# Patient Record
Sex: Male | Born: 1955 | Race: White | Hispanic: No | Marital: Married | Smoking: Former smoker
Health system: Southern US, Community
[De-identification: ages and names within clinical notes are randomized; demographics above are authoritative.]

## PROBLEM LIST (undated history)

## (undated) DIAGNOSIS — F32A Depression, unspecified: Secondary | ICD-10-CM

## (undated) DIAGNOSIS — T7840XA Allergy, unspecified, initial encounter: Secondary | ICD-10-CM

## (undated) DIAGNOSIS — R17 Unspecified jaundice: Secondary | ICD-10-CM

## (undated) DIAGNOSIS — R51 Headache: Secondary | ICD-10-CM

## (undated) DIAGNOSIS — F419 Anxiety disorder, unspecified: Secondary | ICD-10-CM

## (undated) DIAGNOSIS — E079 Disorder of thyroid, unspecified: Secondary | ICD-10-CM

## (undated) DIAGNOSIS — K589 Irritable bowel syndrome without diarrhea: Secondary | ICD-10-CM

## (undated) DIAGNOSIS — Z8739 Personal history of other diseases of the musculoskeletal system and connective tissue: Secondary | ICD-10-CM

## (undated) DIAGNOSIS — I10 Essential (primary) hypertension: Secondary | ICD-10-CM

## (undated) DIAGNOSIS — Z87442 Personal history of urinary calculi: Secondary | ICD-10-CM

## (undated) DIAGNOSIS — G4730 Sleep apnea, unspecified: Secondary | ICD-10-CM

## (undated) DIAGNOSIS — F329 Major depressive disorder, single episode, unspecified: Secondary | ICD-10-CM

## (undated) HISTORY — DX: Depression, unspecified: F32.A

## (undated) HISTORY — DX: Hypercalcemia: E83.52

## (undated) HISTORY — DX: Major depressive disorder, single episode, unspecified: F32.9

## (undated) HISTORY — DX: Personal history of other diseases of the musculoskeletal system and connective tissue: Z87.39

## (undated) HISTORY — DX: Essential (primary) hypertension: I10

## (undated) HISTORY — DX: Irritable bowel syndrome, unspecified: K58.9

## (undated) HISTORY — DX: Anxiety disorder, unspecified: F41.9

## (undated) HISTORY — DX: Allergy, unspecified, initial encounter: T78.40XA

## (undated) HISTORY — DX: Disorder of thyroid, unspecified: E07.9

---

## 1998-03-31 HISTORY — PX: MOLE REMOVAL: SHX2046

## 2000-03-31 DIAGNOSIS — Z87442 Personal history of urinary calculi: Secondary | ICD-10-CM

## 2000-03-31 HISTORY — DX: Personal history of urinary calculi: Z87.442

## 2005-10-13 ENCOUNTER — Encounter: Admission: RE | Admit: 2005-10-13 | Discharge: 2005-10-13 | Payer: Self-pay | Admitting: Specialist

## 2006-01-13 ENCOUNTER — Encounter: Admission: RE | Admit: 2006-01-13 | Discharge: 2006-02-16 | Payer: Self-pay | Admitting: Family Medicine

## 2007-06-14 ENCOUNTER — Emergency Department (HOSPITAL_COMMUNITY): Admission: EM | Admit: 2007-06-14 | Discharge: 2007-06-14 | Payer: Self-pay | Admitting: Emergency Medicine

## 2008-03-31 HISTORY — PX: COLONOSCOPY: SHX174

## 2009-01-26 ENCOUNTER — Emergency Department (HOSPITAL_COMMUNITY): Admission: EM | Admit: 2009-01-26 | Discharge: 2009-01-26 | Payer: Self-pay | Admitting: Emergency Medicine

## 2010-04-21 ENCOUNTER — Encounter: Payer: Self-pay | Admitting: General Surgery

## 2010-07-04 LAB — COMPREHENSIVE METABOLIC PANEL
AST: 30 U/L (ref 0–37)
Alkaline Phosphatase: 65 U/L (ref 39–117)
BUN: 16 mg/dL (ref 6–23)
CO2: 27 mEq/L (ref 19–32)
Chloride: 107 mEq/L (ref 96–112)
GFR calc Af Amer: >60 mL/min (ref >60)
Glucose, Bld: 126 mg/dL — ABNORMAL HIGH (ref 70–99)
Potassium: 4.3 mEq/L (ref 3.5–5.1)
Sodium: 139 mEq/L (ref 135–145)
Total Bilirubin: 1.6 mg/dL — ABNORMAL HIGH (ref 0.3–1.2)
Total Protein: 6.7 g/dL (ref 6.0–8.3)

## 2010-07-04 LAB — DIFFERENTIAL
Basophils Absolute: 0 10*3/uL (ref 0.0–0.1)
Basophils Relative: 0 % (ref 0–1)
Eosinophils Absolute: 0 10*3/uL (ref 0.0–0.7)
Monocytes Absolute: 0.3 10*3/uL (ref 0.1–1.0)
Monocytes Relative: 4 % (ref 3–12)

## 2010-07-04 LAB — URINALYSIS, ROUTINE W REFLEX MICROSCOPIC
Bilirubin Urine: NEGATIVE
Glucose, UA: NEGATIVE mg/dL
Hgb urine dipstick: NEGATIVE
Ketones, ur: NEGATIVE mg/dL
Protein, ur: NEGATIVE mg/dL
Urobilinogen, UA: 1 mg/dL (ref 0.0–1.0)

## 2010-07-04 LAB — CBC
HCT: 38 % — ABNORMAL LOW (ref 39.0–52.0)
Hemoglobin: 12.9 g/dL — ABNORMAL LOW (ref 13.0–17.0)
RBC: 4.15 MIL/uL — ABNORMAL LOW (ref 4.22–5.81)

## 2010-12-23 LAB — URINALYSIS, ROUTINE W REFLEX MICROSCOPIC
Bilirubin Urine: NEGATIVE
Glucose, UA: NEGATIVE
Hgb urine dipstick: NEGATIVE
Ketones, ur: NEGATIVE
pH: 5.5

## 2010-12-23 LAB — I-STAT 8, (EC8 V) (CONVERTED LAB)
Acid-Base Excess: 2
BUN: 17
Chloride: 105
Glucose, Bld: 117 — ABNORMAL HIGH
Potassium: 3.8
pH, Ven: 7.328 — ABNORMAL HIGH

## 2010-12-23 LAB — HEPATIC FUNCTION PANEL
AST: 24
Albumin: 4.1

## 2010-12-23 LAB — DIFFERENTIAL
Basophils Absolute: 0
Basophils Relative: 0
Lymphocytes Relative: 25
Monocytes Relative: 8
Neutro Abs: 3.8
Neutrophils Relative %: 65

## 2010-12-23 LAB — CBC
MCHC: 35.2
RBC: 4.35
RDW: 12.9

## 2010-12-23 LAB — POCT I-STAT CREATININE
Creatinine, Ser: 1.3
Operator id: 277751

## 2011-06-26 ENCOUNTER — Other Ambulatory Visit: Payer: Self-pay | Admitting: Family Medicine

## 2011-06-26 DIAGNOSIS — M542 Cervicalgia: Secondary | ICD-10-CM

## 2011-07-05 ENCOUNTER — Ambulatory Visit
Admission: RE | Admit: 2011-07-05 | Discharge: 2011-07-05 | Disposition: A | Discharge status: Home / Self Care | Payer: BC Managed Care – PPO | Source: Ambulatory Visit | Attending: Family Medicine | Admitting: Family Medicine

## 2011-07-05 DIAGNOSIS — M542 Cervicalgia: Secondary | ICD-10-CM

## 2011-07-05 MED ORDER — GADOBENATE DIMEGLUMINE 529 MG/ML IV SOLN
20.0000 mL | Freq: Once | INTRAVENOUS | Status: AC | PRN
  Administered 2011-07-05: 20 mL via INTRAVENOUS

## 2011-07-22 ENCOUNTER — Encounter (INDEPENDENT_AMBULATORY_CARE_PROVIDER_SITE_OTHER): Payer: Self-pay | Admitting: General Surgery

## 2011-07-24 ENCOUNTER — Encounter (INDEPENDENT_AMBULATORY_CARE_PROVIDER_SITE_OTHER): Payer: Self-pay | Admitting: General Surgery

## 2011-07-31 ENCOUNTER — Encounter (INDEPENDENT_AMBULATORY_CARE_PROVIDER_SITE_OTHER): Payer: BC Managed Care – PPO | Admitting: General Surgery

## 2011-10-14 ENCOUNTER — Encounter (INDEPENDENT_AMBULATORY_CARE_PROVIDER_SITE_OTHER): Payer: Self-pay | Admitting: General Surgery

## 2011-10-17 ENCOUNTER — Ambulatory Visit (INDEPENDENT_AMBULATORY_CARE_PROVIDER_SITE_OTHER): Payer: BC Managed Care – PPO | Admitting: General Surgery

## 2011-10-17 ENCOUNTER — Encounter (INDEPENDENT_AMBULATORY_CARE_PROVIDER_SITE_OTHER): Payer: Self-pay | Admitting: General Surgery

## 2011-10-24 ENCOUNTER — Encounter (INDEPENDENT_AMBULATORY_CARE_PROVIDER_SITE_OTHER): Payer: Self-pay | Admitting: General Surgery

## 2011-10-24 ENCOUNTER — Ambulatory Visit (HOSPITAL_COMMUNITY)
Admission: RE | Admit: 2011-10-24 | Discharge: 2011-10-24 | Disposition: A | Discharge status: Home / Self Care | Payer: BC Managed Care – PPO | Source: Ambulatory Visit | Attending: General Surgery | Admitting: General Surgery

## 2011-10-24 ENCOUNTER — Encounter (HOSPITAL_COMMUNITY)
Admission: RE | Admit: 2011-10-24 | Discharge: 2011-10-24 | Disposition: A | Discharge status: Home / Self Care | Payer: BC Managed Care – PPO | Source: Ambulatory Visit | Attending: General Surgery | Admitting: General Surgery

## 2011-10-24 MED ORDER — SODIUM PERTECHNETATE TC 99M INJECTION
25.0000 | Freq: Once | INTRAVENOUS | Status: AC | PRN
  Administered 2011-10-24: 25 via INTRAVENOUS

## 2011-10-24 NOTE — Progress Notes (Signed)
Subjective:     Patient ID: Wayne Dixon, male   DOB: 1955-11-22, 56 y.o.   MRN: [id number]  HPI We're asked to see the patient in consultation by Dr. Abigail Miyamoto to evaluate him for elevated calcium levels. The patient is a 56 year old white male who has been having some very vague symptoms. He experienced a kidney stone in 2000 but has not had another episode of this. He states that 25 years ago he was found to be hyperthyroid and this was treated medically. Over the last year or so he's had a couple episodes of right upper quadrant pain but has not had this recently. He did have an episode of diarrhea and stomach cramps last night but this has resolved. Except for these occasional issues in general he feels well most of the time. He recently had an ionized calcium checked which was just slightly above normal. He also had a parathyroid hormone checked it was in the normal range.  Review of Systems  Constitutional: Negative.   HENT: Negative.   Eyes: Negative.   Respiratory: Negative.   Cardiovascular: Negative.   Gastrointestinal: Negative.   Genitourinary: Negative.   Musculoskeletal: Negative.   Skin: Negative.   Neurological: Negative.   Hematological: Negative.   Psychiatric/Behavioral: Negative.        Objective:   Physical Exam  Constitutional: He is oriented to person, place, and time. He appears well-developed and well-nourished.  HENT:  Head: Normocephalic and atraumatic.  Eyes: Conjunctivae and EOM are normal. Pupils are equal, round, and reactive to light.  Neck: Normal range of motion. Neck supple. No thyromegaly present.  Cardiovascular: Normal rate, regular rhythm and normal heart sounds.   Pulmonary/Chest: Effort normal and breath sounds normal.  Abdominal: Soft. Bowel sounds are normal. He exhibits no mass. There is no tenderness.  Musculoskeletal: Normal range of motion.  Lymphadenopathy:    He has no cervical adenopathy.  Neurological: He is alert and oriented to  person, place, and time.  Skin: Skin is warm and dry.  Psychiatric: He has a normal mood and affect. His behavior is normal.       Assessment:     The patient seems to have some slight hypercalcemia but it's not clear whether his symptoms are related to this. His parathyroid hormone is normal. He had a recent MRI study that questioned whether he could have a parathyroid adenoma.    Plan:     At this point we will obtain a sestamibi scan to look for a hyperfunctioning parathyroid gland. I would also like him to see Dr. Gerrit Friends in our group for a second opinion

## 2011-11-10 ENCOUNTER — Encounter (INDEPENDENT_AMBULATORY_CARE_PROVIDER_SITE_OTHER): Payer: Self-pay

## 2011-11-24 ENCOUNTER — Ambulatory Visit (INDEPENDENT_AMBULATORY_CARE_PROVIDER_SITE_OTHER): Payer: BC Managed Care – PPO | Admitting: Surgery

## 2011-11-24 ENCOUNTER — Encounter (INDEPENDENT_AMBULATORY_CARE_PROVIDER_SITE_OTHER): Payer: Self-pay | Admitting: Surgery

## 2011-11-24 VITALS — BP 158/96 | HR 85 | Temp 97.0°F | Resp 18 | Ht 72.0 in | Wt 244.4 lb

## 2011-11-24 DIAGNOSIS — E210 Primary hyperparathyroidism: Secondary | ICD-10-CM

## 2011-11-24 NOTE — Progress Notes (Signed)
General Surgery [facility]  Chief Complaint  Patient presents with  . Pre-op Exam    Eval thyriod    HISTORY: Patient is a 56 year old white male referred by his primary care physician for hypercalcemia. Serum calcium level is elevated at 10.9 and an ionized calcium level is elevated at 5.9. Intact PTH level was upper range of normal at 64. The patient subsequently underwent MRI scan of the neck which showed a 7 x 9 mm nodule in the left inferior position. A nuclear medicine parathyroid scan performed on 10/24/2011 demonstrated activity in the left inferior position corresponding with a nodule seen on MRI scan and consistent with a parathyroid adenoma.  Patient has had symptoms including fatigue, irritability, bone and joint pain, abdominal pain with negative GI workup, and history of nephrolithiasis in 2002. Patient has a distant history of hyperthyroidism managed medically.  Past Medical History  Diagnosis Date  . History of gout   . Hypercalcemia   . Depression   . Hypertension   . Anxiety   . Allergy   . IBS (irritable bowel syndrome)   . Thyroid disease      Current Outpatient Prescriptions  Medication Sig Dispense Refill  . benazepril (LOTENSIN) 20 MG tablet Take 20 mg by mouth daily.      . citalopram (CELEXA) 20 MG tablet Take 20 mg by mouth daily.      . fexofenadine (ALLEGRA) 180 MG tablet Take 180 mg by mouth daily.      . montelukast (SINGULAIR) 10 MG tablet Take 10 mg by mouth at bedtime.         No Known Allergies   History reviewed. No pertinent family history.   History   Social History  . Marital Status: Married    Spouse Name: N/A    Number of Children: N/A  . Years of Education: N/A   Social History Main Topics  . Smoking status: Former Games developer  . Smokeless tobacco: Former Neurosurgeon    Quit date: 10/17/1986  . Alcohol Use: Yes  . Drug Use: No  . Sexually Active:    Other Topics Concern  . None   Social History Narrative    . None     REVIEW OF SYSTEMS - PERTINENT POSITIVES ONLY: Fatigue, irritability, bone and joint pain, nephrolithiasis, abdominal pain.  EXAM: Filed Vitals:   11/24/11 1408  BP: 158/96  Pulse: 85  Temp: 97 F (36.1 C)  Resp: 18    HEENT: normocephalic; pupils equal and reactive; sclerae clear; dentition good; mucous membranes moist NECK:  symmetric on extension; no palpable anterior or posterior cervical lymphadenopathy; no supraclavicular masses; no tenderness CHEST: clear to auscultation bilaterally without rales, rhonchi, or wheezes CARDIAC: regular rate and rhythm without significant murmur; peripheral pulses are full ABDOMEN: soft without distension; bowel sounds present; no mass; no hepatosplenomegaly; no hernia EXT:  non-tender without edema; no deformity NEURO: no gross focal deficits; no sign of tremor   LABORATORY RESULTS: See [facility] ([facility]) for most recent results   RADIOLOGY RESULTS: See [facility] ([facility]pic) for most recent results   IMPRESSION: Primary hyperparathyroidism, 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 I suspect that he has a parathyroid adenoma in the left inferior position resulting in primary hyperparathyroidism. Patient is symptomatic. I have recommended minimally invasive surgery for left inferior parathyroidectomy. We have discussed the possibility of 4 gland exploration. I provided him with  written literature to review. He understands and wishes to proceed. We will make arrangements for surgery in the near future.  The risks and benefits of the procedure have been discussed at length with the patient.  The patient understands the proposed procedure, potential alternative treatments, and the course of recovery to be expected.  All of the patient's questions have been answered at this time.  The patient wishes to proceed with surgery.  Velora Heckler, MD, FACS General & Endocrine Surgery [facility]   Visit Diagnoses: 1. Hyperparathyroidism, primary     Primary Care Physician: Irving Copas, MD

## 2011-11-24 NOTE — Patient Instructions (Signed)
[facility] [phone]  THYROID & PARATHYROID SURGERY -- POST OP INSTRUCTIONS  Always review your discharge instruction sheet from the facility where your surgery was performed.  1. A prescription for pain medication may be given to you upon discharge.  Take your pain medication as prescribed.  If narcotic pain medicine is not needed, then you may take acetaminophen (Tylenol) or ibuprofen (Advil) as needed. 2. Take your usually prescribed medications unless otherwise directed. 3. If you need a refill on your pain medication, please contact your pharmacy. They will contact our office to request authorization.  Prescriptions will not be processed after 5 pm or on weekends. 4. Start with a light diet upon arrival home, such as soup and crackers or toast.  Be sure to drink plenty of fluids daily.  Resume your normal diet the day after surgery. 5. Most patients will experience some swelling and bruising on the chest and neck area.  Ice packs will help.  Swelling and bruising can take several days to resolve.  6. It is common to experience some constipation if taking pain medication after surgery.  Increasing fluid intake and taking a stool softener will usually help or prevent this problem.  A mild laxative (Milk of Magnesia or Miralax) should be taken according to package directions if there are no bowel movements after 48 hours. 7. You may remove your bandages 24-48 hours after surgery, and you may shower at that time.  You have steri-strips (small skin tapes) in place directly over the incision.  These strips should be left on the skin for 7-10 days and then removed. 8. You may resume regular (light) daily activities beginning the next day-such as daily self-care, walking, climbing stairs-gradually increasing activities as tolerated.  You may have sexual intercourse when it is comfortable.  Refrain from any heavy lifting or straining until approved by your doctor.  You may drive when  you no longer are taking prescription pain medication, you can comfortably wear a seatbelt, and you can safely maneuver your car and apply brakes. 9. You should see your doctor in the office for a follow-up appointment approximately two to three weeks after your surgery.  Make sure that you call for this appointment within a day or two after you arrive home to insure a convenient appointment time.  WHEN TO CALL YOUR DOCTOR: 1. Fever greater than 101.5 2. Inability to urinate 3. Nausea and/or vomiting - persistent 4. Extreme swelling or bruising 5. Continued bleeding from incision 6. Increased pain, redness, or drainage from the incision 7. Difficulty swallowing or breathing 8. Muscle cramping or spasms 9. Numbness or tingling in hands or around lips  The clinic staff is available to answer your questions during regular business hours.  Please don't hesitate to call and ask to speak to one of the nurses if you have concerns. [facility] [phone]  [email or web address]   

## 2011-12-18 ENCOUNTER — Other Ambulatory Visit (INDEPENDENT_AMBULATORY_CARE_PROVIDER_SITE_OTHER): Payer: Self-pay | Admitting: Surgery

## 2011-12-18 ENCOUNTER — Encounter (HOSPITAL_COMMUNITY): Payer: Self-pay | Admitting: Pharmacy Technician

## 2011-12-18 NOTE — Progress Notes (Signed)
Dr.Gerkin,    Please enter preop orders in Epic for Wayne Dixon--his surgery date is 9/26 --he is coming to [facility]  For preop labs on Monday 9/23.    Evalina Field RN

## 2011-12-22 ENCOUNTER — Encounter (HOSPITAL_COMMUNITY): Payer: Self-pay

## 2011-12-22 ENCOUNTER — Encounter (HOSPITAL_COMMUNITY)
Admission: RE | Admit: 2011-12-22 | Discharge: 2011-12-22 | Disposition: A | Discharge status: Home / Self Care | Payer: BC Managed Care – PPO | Source: Ambulatory Visit | Attending: Surgery | Admitting: Surgery

## 2011-12-22 ENCOUNTER — Ambulatory Visit (HOSPITAL_COMMUNITY)
Admission: RE | Admit: 2011-12-22 | Discharge: 2011-12-22 | Disposition: A | Discharge status: Home / Self Care | Payer: BC Managed Care – PPO | Source: Ambulatory Visit | Attending: Surgery | Admitting: Surgery

## 2011-12-22 DIAGNOSIS — E210 Primary hyperparathyroidism: Secondary | ICD-10-CM | POA: Insufficient documentation

## 2011-12-22 DIAGNOSIS — Z01810 Encounter for preprocedural cardiovascular examination: Secondary | ICD-10-CM | POA: Insufficient documentation

## 2011-12-22 DIAGNOSIS — Z01812 Encounter for preprocedural laboratory examination: Secondary | ICD-10-CM | POA: Insufficient documentation

## 2011-12-22 DIAGNOSIS — I10 Essential (primary) hypertension: Secondary | ICD-10-CM | POA: Insufficient documentation

## 2011-12-22 HISTORY — DX: Sleep apnea, unspecified: G47.30

## 2011-12-22 HISTORY — DX: Unspecified jaundice: R17

## 2011-12-22 HISTORY — DX: Personal history of urinary calculi: Z87.442

## 2011-12-22 HISTORY — DX: Headache: R51

## 2011-12-22 LAB — CBC
HCT: 42.7 % (ref 39.0–52.0)
MCH: 30.9 pg (ref 26.0–34.0)
MCHC: 34.9 g/dL (ref 30.0–36.0)
MCV: 88.6 fL (ref 78.0–100.0)
RDW: 12.9 % (ref 11.5–15.5)

## 2011-12-22 LAB — BASIC METABOLIC PANEL
BUN: 17 mg/dL (ref 6–23)
Creatinine, Ser: 1.13 mg/dL (ref 0.50–1.35)
GFR calc Af Amer: 83 mL/min — ABNORMAL LOW (ref >90)
GFR calc non Af Amer: 72 mL/min — ABNORMAL LOW (ref >90)

## 2011-12-22 NOTE — Patient Instructions (Addendum)
[name]  12/22/2011   Your procedure is scheduled on: 12/25/11 at 7:30 am   Report to [facility]  AT: 5:15 AM.  Call this number if you have problems the morning of surgery: [phone]   Remember:   Do not eat food or drink liquids AFTER MIDNIGHT    Take these medicines the morning of surgery with A SIP OF WATER: CELEXA    Do not wear jewelry, make-up   Do not wear lotions, powders, or perfumes.   Do not shave legs or underarms 12 hrs. before surgery (men may shave face)  Do not bring valuables to the hospital.  Contacts, dentures or bridgework may not be worn into surgery.  Leave suitcase in the car. After surgery it may be brought to your room.  For patients admitted to the hospital more than one night, checkout time is 11:00   AM the day of discharge.   Patients discharged the day of surgery will not be allowed to drive home.  If going home same day of surgery, must have someone stay with you first  24 hrs at home and arrange for some one to drive you home from hospital.    Special Instructions:   Please read over the following fact sheets that you were given:               1. MRSA  INFORMATION                      2. [facility] PREPARING FOR SURGERY SHEET                                     X_______________________________________________________________

## 2011-12-25 ENCOUNTER — Encounter (HOSPITAL_COMMUNITY): Payer: Self-pay | Admitting: Anesthesiology

## 2011-12-25 ENCOUNTER — Encounter (HOSPITAL_COMMUNITY): Payer: Self-pay | Admitting: *Deleted

## 2011-12-25 ENCOUNTER — Ambulatory Visit (HOSPITAL_COMMUNITY): Payer: BC Managed Care – PPO | Admitting: Anesthesiology

## 2011-12-25 ENCOUNTER — Other Ambulatory Visit (INDEPENDENT_AMBULATORY_CARE_PROVIDER_SITE_OTHER): Payer: Self-pay

## 2011-12-25 ENCOUNTER — Encounter (HOSPITAL_COMMUNITY)
Admission: RE | Disposition: A | Discharge status: Home / Self Care | Payer: Self-pay | Source: Ambulatory Visit | Attending: Surgery

## 2011-12-25 ENCOUNTER — Ambulatory Visit (HOSPITAL_COMMUNITY)
Admission: RE | Admit: 2011-12-25 | Discharge: 2011-12-25 | Disposition: A | Discharge status: Home / Self Care | Payer: BC Managed Care – PPO | Source: Ambulatory Visit | Attending: Surgery | Admitting: Surgery

## 2011-12-25 ENCOUNTER — Telehealth (INDEPENDENT_AMBULATORY_CARE_PROVIDER_SITE_OTHER): Payer: Self-pay

## 2011-12-25 ENCOUNTER — Encounter (HOSPITAL_COMMUNITY): Payer: Self-pay | Admitting: Surgery

## 2011-12-25 DIAGNOSIS — G4730 Sleep apnea, unspecified: Secondary | ICD-10-CM | POA: Insufficient documentation

## 2011-12-25 DIAGNOSIS — E210 Primary hyperparathyroidism: Secondary | ICD-10-CM | POA: Diagnosis present

## 2011-12-25 DIAGNOSIS — D351 Benign neoplasm of parathyroid gland: Secondary | ICD-10-CM

## 2011-12-25 DIAGNOSIS — I10 Essential (primary) hypertension: Secondary | ICD-10-CM | POA: Insufficient documentation

## 2011-12-25 DIAGNOSIS — Z79899 Other long term (current) drug therapy: Secondary | ICD-10-CM | POA: Insufficient documentation

## 2011-12-25 HISTORY — PX: PARATHYROIDECTOMY: SHX19

## 2011-12-25 SURGERY — PARATHYROIDECTOMY
Anesthesia: General | Site: Neck | Wound class: Clean

## 2011-12-25 MED ORDER — LIDOCAINE HCL (CARDIAC) 20 MG/ML IV SOLN
INTRAVENOUS | Status: DC | PRN
  Administered 2011-12-25: 50 mg via INTRAVENOUS

## 2011-12-25 MED ORDER — ACETAMINOPHEN 10 MG/ML IV SOLN
INTRAVENOUS | Status: DC | PRN
  Administered 2011-12-25: 1000 mg via INTRAVENOUS

## 2011-12-25 MED ORDER — HYDROCODONE-ACETAMINOPHEN 5-325 MG PO TABS
1.0000 | ORAL_TABLET | ORAL | Status: DC | PRN
  Administered 2011-12-25: 1 via ORAL
  Filled 2011-12-25: qty 1

## 2011-12-25 MED ORDER — LACTATED RINGERS IV SOLN
INTRAVENOUS | Status: DC
  Administered 2011-12-25: 08:00:00 via INTRAVENOUS
  Administered 2011-12-25: 1000 mL via INTRAVENOUS

## 2011-12-25 MED ORDER — BUPIVACAINE HCL 0.25 % IJ SOLN
INTRAMUSCULAR | Status: DC | PRN
  Administered 2011-12-25: 10 mL

## 2011-12-25 MED ORDER — ACETAMINOPHEN 10 MG/ML IV SOLN
INTRAVENOUS | Status: AC
  Filled 2011-12-25: qty 100

## 2011-12-25 MED ORDER — HYDROMORPHONE HCL PF 1 MG/ML IJ SOLN: 0.2500 mg | INTRAMUSCULAR | Status: DC | PRN

## 2011-12-25 MED ORDER — HYDROCODONE-ACETAMINOPHEN 5-325 MG PO TABS: 1.0000 | ORAL_TABLET | ORAL | Status: DC | PRN

## 2011-12-25 MED ORDER — PROPOFOL 10 MG/ML IV BOLUS
INTRAVENOUS | Status: DC | PRN
  Administered 2011-12-25: 230 mg via INTRAVENOUS

## 2011-12-25 MED ORDER — NEOSTIGMINE METHYLSULFATE 1 MG/ML IJ SOLN
INTRAMUSCULAR | Status: DC | PRN
  Administered 2011-12-25: 4 mg via INTRAVENOUS

## 2011-12-25 MED ORDER — LACTATED RINGERS IV SOLN: INTRAVENOUS | Status: DC

## 2011-12-25 MED ORDER — ONDANSETRON HCL 4 MG/2ML IJ SOLN
INTRAMUSCULAR | Status: DC | PRN
  Administered 2011-12-25: 4 mg via INTRAVENOUS

## 2011-12-25 MED ORDER — CEFAZOLIN SODIUM-DEXTROSE 2-3 GM-% IV SOLR
INTRAVENOUS | Status: AC
  Filled 2011-12-25: qty 50

## 2011-12-25 MED ORDER — ROCURONIUM BROMIDE 100 MG/10ML IV SOLN
INTRAVENOUS | Status: DC | PRN
  Administered 2011-12-25: 5 mg via INTRAVENOUS
  Administered 2011-12-25 (×2): 15 mg via INTRAVENOUS
  Administered 2011-12-25: 10 mg via INTRAVENOUS
  Administered 2011-12-25: 20 mg via INTRAVENOUS

## 2011-12-25 MED ORDER — HYDRALAZINE HCL 20 MG/ML IJ SOLN
INTRAMUSCULAR | Status: DC | PRN
  Administered 2011-12-25 (×3): 5 mg via INTRAVENOUS

## 2011-12-25 MED ORDER — SUCCINYLCHOLINE CHLORIDE 20 MG/ML IJ SOLN
INTRAMUSCULAR | Status: DC | PRN
  Administered 2011-12-25: 120 mg via INTRAVENOUS

## 2011-12-25 MED ORDER — FENTANYL CITRATE 0.05 MG/ML IJ SOLN
INTRAMUSCULAR | Status: DC | PRN
  Administered 2011-12-25: 100 ug via INTRAVENOUS
  Administered 2011-12-25 (×2): 50 ug via INTRAVENOUS
  Administered 2011-12-25: 100 ug via INTRAVENOUS
  Administered 2011-12-25: 50 ug via INTRAVENOUS
  Administered 2011-12-25: 100 ug via INTRAVENOUS

## 2011-12-25 MED ORDER — FENTANYL CITRATE 0.05 MG/ML IJ SOLN
INTRAMUSCULAR | Status: AC
  Filled 2011-12-25: qty 2

## 2011-12-25 MED ORDER — 0.9 % SODIUM CHLORIDE (POUR BTL) OPTIME
TOPICAL | Status: DC | PRN
  Administered 2011-12-25: 1000 mL

## 2011-12-25 MED ORDER — BUPIVACAINE HCL (PF) 0.25 % IJ SOLN
INTRAMUSCULAR | Status: AC
  Filled 2011-12-25: qty 30

## 2011-12-25 MED ORDER — GLYCOPYRROLATE 0.2 MG/ML IJ SOLN
INTRAMUSCULAR | Status: DC | PRN
  Administered 2011-12-25: 0.6 mg via INTRAVENOUS

## 2011-12-25 MED ORDER — FENTANYL CITRATE 0.05 MG/ML IJ SOLN
25.0000 ug | INTRAMUSCULAR | Status: DC | PRN
  Administered 2011-12-25: 50 ug via INTRAVENOUS

## 2011-12-25 MED ORDER — CEFAZOLIN SODIUM-DEXTROSE 2-3 GM-% IV SOLR
2.0000 g | INTRAVENOUS | Status: AC
  Administered 2011-12-25: 2 g via INTRAVENOUS

## 2011-12-25 SURGICAL SUPPLY — 42 items
ATTRACTOMAT 16X20 MAGNETIC DRP (DRAPES) ×2 IMPLANT
BENZOIN TINCTURE PRP APPL 2/3 (GAUZE/BANDAGES/DRESSINGS) ×2 IMPLANT
BLADE HEX COATED 2.75 (ELECTRODE) ×2 IMPLANT
BLADE SURG 15 STRL LF DISP TIS (BLADE) ×1 IMPLANT
BLADE SURG 15 STRL SS (BLADE) ×1
CANISTER SUCTION 2500CC (MISCELLANEOUS) ×2 IMPLANT
CHLORAPREP W/TINT 10.5 ML (MISCELLANEOUS) ×2 IMPLANT
CLIP TI MEDIUM 6 (CLIP) ×2 IMPLANT
CLIP TI WIDE RED SMALL 6 (CLIP) ×4 IMPLANT
CLOTH BEACON ORANGE TIMEOUT ST (SAFETY) ×2 IMPLANT
CONT SPECI 4OZ STER CLIK (MISCELLANEOUS) ×2 IMPLANT
DISSECTOR ROUND CHERRY 3/8 STR (MISCELLANEOUS) ×2 IMPLANT
DRAPE PED LAPAROTOMY (DRAPES) ×2 IMPLANT
DRESSING SURGICEL FIBRLLR 1X2 (HEMOSTASIS) ×1 IMPLANT
DRSG SURGICEL FIBRILLAR 1X2 (HEMOSTASIS) ×2
ELECT REM PT RETURN 9FT ADLT (ELECTROSURGICAL) ×2
ELECTRODE REM PT RTRN 9FT ADLT (ELECTROSURGICAL) ×1 IMPLANT
GAUZE SPONGE 4X4 16PLY XRAY LF (GAUZE/BANDAGES/DRESSINGS) ×2 IMPLANT
GLOVE BIOGEL PI IND STRL 6.5 (GLOVE) ×1 IMPLANT
GLOVE BIOGEL PI INDICATOR 6.5 (GLOVE) ×1
GLOVE SURG ORTHO 8.0 STRL STRW (GLOVE) ×2 IMPLANT
GLOVE SURG SS PI 6.5 STRL IVOR (GLOVE) ×2 IMPLANT
GOWN STRL NON-REIN LRG LVL3 (GOWN DISPOSABLE) ×4 IMPLANT
GOWN STRL REIN XL XLG (GOWN DISPOSABLE) ×2 IMPLANT
KIT BASIN OR (CUSTOM PROCEDURE TRAY) ×2 IMPLANT
NEEDLE HYPO 25X1 1.5 SAFETY (NEEDLE) ×2 IMPLANT
NS IRRIG 1000ML POUR BTL (IV SOLUTION) ×2 IMPLANT
PACK BASIC VI WITH GOWN DISP (CUSTOM PROCEDURE TRAY) ×2 IMPLANT
PENCIL BUTTON HOLSTER BLD 10FT (ELECTRODE) ×2 IMPLANT
SPONGE GAUZE 4X4 12PLY (GAUZE/BANDAGES/DRESSINGS) ×2 IMPLANT
STAPLER VISISTAT 35W (STAPLE) IMPLANT
STRIP CLOSURE SKIN 1/2X4 (GAUZE/BANDAGES/DRESSINGS) ×2 IMPLANT
SUT MNCRL AB 4-0 PS2 18 (SUTURE) ×2 IMPLANT
SUT SILK 2 0 (SUTURE)
SUT SILK 2-0 18XBRD TIE 12 (SUTURE) IMPLANT
SUT SILK 3 0 (SUTURE)
SUT SILK 3-0 18XBRD TIE 12 (SUTURE) IMPLANT
SUT VIC AB 3-0 SH 18 (SUTURE) ×2 IMPLANT
SYR BULB IRRIGATION 50ML (SYRINGE) ×2 IMPLANT
SYR CONTROL 10ML LL (SYRINGE) ×2 IMPLANT
TOWEL OR 17X26 10 PK STRL BLUE (TOWEL DISPOSABLE) ×2 IMPLANT
YANKAUER SUCT BULB TIP 10FT TU (MISCELLANEOUS) ×2 IMPLANT

## 2011-12-25 NOTE — H&P (Signed)
Wayne Dixon is an 56 y.o. male.    Chief Complaint: hypercalcemia, primary hyperparathyroidism  HPI: Patient is a 56 year old white male referred by his primary care physician for hypercalcemia. Serum calcium level is elevated at 10.9 and an ionized calcium level is elevated at 5.9. Intact PTH level was upper range of normal at 64. The patient subsequently underwent MRI scan of the neck which showed a 7 x 9 mm nodule in the left inferior position. A nuclear medicine parathyroid scan performed on 10/24/2011 demonstrated activity in the left inferior position corresponding with a nodule seen on MRI scan and consistent with a parathyroid adenoma.  Patient has had symptoms including fatigue, irritability, bone and joint pain, abdominal pain with negative GI workup, and history of nephrolithiasis in 2002. Patient has a distant history of hyperthyroidism managed medically.   Past Medical History  Diagnosis Date  . History of gout   . Hypercalcemia   . Depression   . Hypertension   . Anxiety   . Allergy   . IBS (irritable bowel syndrome)   . Thyroid disease   . Headache     due to cervical disc problem  . History of kidney stones 2002  . Elevated bilirubin   . Sleep apnea     mild doses not use c-pap    Past Surgical History  Procedure Date  . Mole removal 2000  . Colonoscopy 2010    History reviewed. No pertinent family history. Social History:  reports that he has quit smoking. He quit smokeless tobacco use about 25 years ago. He reports that he drinks alcohol. He reports that he does not use illicit drugs.  Allergies: No Known Allergies  Medications Prior to Admission  Medication Sig Dispense Refill  . benazepril (LOTENSIN) 40 MG tablet Take 20 mg by mouth daily with breakfast. Takes 1/2 tablet of 40mg  twice daily      . citalopram (CELEXA) 40 MG tablet Take 40 mg by mouth daily with breakfast. Takes 1/2 of 40 mg tab twice daily      . cyclobenzaprine (FLEXERIL) 10 MG tablet  Take 5-10 mg by mouth at bedtime as needed. Muscle spasms        No results found for this or any previous visit (from the past 48 hour(s)). No results found.  Review of Systems  Constitutional: Positive for malaise/fatigue.  HENT: Negative.   Eyes: Negative.   Respiratory: Negative.   Cardiovascular: Negative.   Gastrointestinal: Positive for abdominal pain.  Genitourinary: Negative.   Musculoskeletal: Positive for back pain and joint pain.  Skin: Negative.   Neurological: Negative.   Endo/Heme/Allergies: Negative.   Psychiatric/Behavioral: Negative.     Blood pressure 136/88, pulse 74, temperature 97.7 F (36.5 C), temperature source Oral, resp. rate 18, SpO2 97.00%. Physical Exam  Constitutional: He is oriented to person, place, and time. He appears well-developed and well-nourished. No distress.  HENT:  Head: Normocephalic and atraumatic.  Right Ear: External ear normal.  Left Ear: External ear normal.  Mouth/Throat: Oropharynx is clear and moist.  Eyes: Conjunctivae normal and EOM are normal. Pupils are equal, round, and reactive to light. No scleral icterus.  Neck: Normal range of motion. Neck supple. No tracheal deviation present. No thyromegaly present.  Cardiovascular: Normal rate, regular rhythm and normal heart sounds.   No murmur heard. Respiratory: Effort normal and breath sounds normal. He has no wheezes.  GI: Soft. Bowel sounds are normal. He exhibits no distension.  Musculoskeletal: Normal range of motion. He  exhibits no edema.  Lymphadenopathy:    He has no cervical adenopathy.  Neurological: He is alert and oriented to person, place, and time.  Skin: Skin is warm and dry.  Psychiatric: He has a normal mood and affect. His behavior is normal. Judgment and thought content normal.     Assessment/Plan Primary hyperparathyroidism, suspect left inferior parathyroid adenoma  - plan neck exploration with parathyroidectomy  The risks and benefits of the  procedure have been discussed at length with the patient.  The patient understands the proposed procedure, potential alternative treatments, and the course of recovery to be expected.  All of the patient's questions have been answered at this time.  The patient wishes to proceed with surgery.  Velora Heckler, MD, FACS General & Endocrine Surgery [facility] Office: [phone]   Alyn Jurney Judie Petit 12/25/2011, 7:24 AM

## 2011-12-25 NOTE — Op Note (Signed)
OPERATIVE REPORT - PARATHYROIDECTOMY  Preoperative diagnosis: Primary hyperparathyroidism  Postop diagnosis: Same  Procedure: Left inferior minimally invasive parathyroidectomy  Surgeon:  Velora Heckler, MD, FACS  Assistant:  Ovidio Kin, MD, FACS  Anesthesia: Gen. endotracheal  Estimated blood loss: Minimal  Preparation: ChloraPrep  Indications: Patient is a 56 year old white male referred by his primary care physician for hypercalcemia. Serum calcium level is elevated at 10.9 and an ionized calcium level is elevated at 5.9. Intact PTH level was upper range of normal at 64. The patient subsequently underwent MRI scan of the neck which showed a 7 x 9 mm nodule in the left inferior position. A nuclear medicine parathyroid scan performed on 10/24/2011 demonstrated activity in the left inferior position corresponding with a nodule seen on MRI scan and consistent with a parathyroid adenoma.  Patient has had symptoms including fatigue, irritability, bone and joint pain, abdominal pain with negative GI workup, and history of nephrolithiasis in 2002. Patient has a distant history of hyperthyroidism managed medically.   Procedure: Patient was prepared in the holding area. He was brought to operating room and placed in a supine position on the operating room table. Following administration of general anesthesia, the patient was positioned and then prepped and draped in the usual strict aseptic fashion. After ascertaining that an adequate level of anesthesia been achieved, a neck incision is made with a #15 blade. Dissection was carried through subcutaneous tissues and platysma. Hemostasis was obtained with the electrocautery. Skin flaps were developed circumferentially and a Weitlander retractor was placed for exposure.  Strap muscles were incised in the midline. Strap muscles were reflected exposing the thyroid lobe. With gentle blunt dissection the thyroid lobe was mobilized.  Dissection was carried  through adipose tissue and an enlarged parathyroid gland is identified. It is gently mobilized. Vascular structures are divided between small and medium ligaclips. Care is taken to avoid the recurrent laryngeal nerve and the esophagus. Gland is completely excised. It is submitted to pathology. Frozen section confirms parathyroid tissue consistent with adenoma.  Neck is irrigated with warm saline. Good hemostasis is noted. Fibrillar is placed in the operative field. Strap muscles are reapproximated in the midline with interrupted 3-0 Vicryl sutures. Platysma was closed with interrupted 3-0 Vicryl sutures. Skin is closed with a running 4-0 Monocryl subcuticular suture. Local anesthetic is infiltrated circumferentially. Wound was washed and dried and benzoin and Steri-Strips are applied. Sterile gauze dressing is applied. Patient is awakened from anesthesia and brought to the recovery room. The patient tolerated the procedure well.  Velora Heckler, MD, FACS General & Endocrine Surgery [facility] Office: [phone]

## 2011-12-25 NOTE — Transfer of Care (Signed)
Immediate Anesthesia Transfer of Care Note  Patient: Wayne Dixon  Procedure(s) Performed: Procedure(s) (LRB) with comments: PARATHYROIDECTOMY (N/A)  Patient Location: PACU  Anesthesia Type: General  Level of Consciousness: awake, alert  and oriented  Airway & Oxygen Therapy: Patient Spontanous Breathing and Patient connected to face mask oxygen  Post-op Assessment: Report given to PACU RN, Post -op Vital signs reviewed and stable and Patient moving all extremities  Post vital signs: Reviewed and stable  Complications: No apparent anesthesia complications

## 2011-12-25 NOTE — Anesthesia Preprocedure Evaluation (Addendum)
Anesthesia Evaluation  Patient identified by MRN, date of birth, ID band Patient awake    Reviewed: Allergy & Precautions, H&P , NPO status , reviewed documented beta blocker date and time   Airway Mallampati: III TM Distance: >3 FB Neck ROM: full    Dental  (+) Chipped and Dental Advisory Given,    Pulmonary sleep apnea ,  No CPAP.  Mild OSA per patient. breath sounds clear to auscultation  Pulmonary exam normal       Cardiovascular Exercise Tolerance: Good hypertension, Pt. on medications Rhythm:regular Rate:Normal     Neuro/Psych negative neurological ROS  negative psych ROS   GI/Hepatic negative GI ROS, Neg liver ROS,   Endo/Other  negative endocrine ROS  Renal/GU negative Renal ROS  negative genitourinary   Musculoskeletal   Abdominal   Peds  Hematology negative hematology ROS (+)   Anesthesia Other Findings   Reproductive/Obstetrics negative OB ROS                          Anesthesia Physical Anesthesia Plan  ASA: III  Anesthesia Plan: General   Post-op Pain Management:    Induction: Intravenous  Airway Management Planned: Oral ETT  Additional Equipment:   Intra-op Plan:   Post-operative Plan: Extubation in OR  Informed Consent: I have reviewed the patients History and Physical, chart, labs and discussed the procedure including the risks, benefits and alternatives for the proposed anesthesia with the patient or authorized representative who has indicated his/her understanding and acceptance.   Dental Advisory Given  Plan Discussed with: CRNA and Surgeon  Anesthesia Plan Comments:         Anesthesia Quick Evaluation

## 2011-12-25 NOTE — Anesthesia Postprocedure Evaluation (Signed)
  Anesthesia Post-op Note  Patient: Wayne Dixon  Procedure(s) Performed: Procedure(s) (LRB): PARATHYROIDECTOMY (N/A)  Patient Location: PACU  Anesthesia Type: General  Level of Consciousness: awake and alert   Airway and Oxygen Therapy: Patient Spontanous Breathing  Post-op Pain: mild  Post-op Assessment: Post-op Vital signs reviewed, Patient's Cardiovascular Status Stable, Respiratory Function Stable, Patent Airway and No signs of Nausea or vomiting  Post-op Vital Signs: stable  Complications: No apparent anesthesia complications

## 2011-12-25 NOTE — Telephone Encounter (Signed)
Lab slip mailed to pt to go to [facility] lab on 10-7.

## 2011-12-26 ENCOUNTER — Encounter (HOSPITAL_COMMUNITY): Payer: Self-pay | Admitting: Surgery

## 2012-01-05 ENCOUNTER — Other Ambulatory Visit (INDEPENDENT_AMBULATORY_CARE_PROVIDER_SITE_OTHER): Payer: Self-pay | Admitting: Surgery

## 2012-01-07 ENCOUNTER — Encounter (INDEPENDENT_AMBULATORY_CARE_PROVIDER_SITE_OTHER): Payer: Self-pay

## 2012-01-07 ENCOUNTER — Encounter (INDEPENDENT_AMBULATORY_CARE_PROVIDER_SITE_OTHER): Payer: Self-pay | Admitting: Surgery

## 2012-01-07 ENCOUNTER — Ambulatory Visit (INDEPENDENT_AMBULATORY_CARE_PROVIDER_SITE_OTHER): Payer: BC Managed Care – PPO | Admitting: Surgery

## 2012-01-07 VITALS — BP 128/64 | HR 70 | Temp 97.1°F | Resp 16 | Ht 74.0 in | Wt 243.2 lb

## 2012-01-07 DIAGNOSIS — E210 Primary hyperparathyroidism: Secondary | ICD-10-CM

## 2012-01-07 NOTE — Progress Notes (Signed)
General Surgery [facility]  Visit Diagnoses: 1. Hyperparathyroidism, primary     HISTORY: Patient returns having undergone minimally invasive parathyroidectomy. Final pathology shows a 2.0 cm gland weighing 1000 mg. Postoperative serum calcium level is normal at 9.9.  EXAM: Steri-Strips are removed in the office today. Wound appears to be healing without complication. No sign of infection. No sign of seroma. Voice quality is normal.  IMPRESSION: Status post minimally invasive parathyroidectomy with normalization of serum calcium level  PLAN: Patient will begin applying topical creams to his incision. We will check an intact PTH level and serum calcium level before his next office visit in 6 weeks.  Velora Heckler, MD, FACS General & Endocrine Surgery [facility]

## 2012-01-07 NOTE — Patient Instructions (Signed)
  COCOA BUTTER & VITAMIN E CREAM  ([employer] or other brand)  Apply cocoa butter/vitamin E cream to your incision 2 - 3 times daily.  Massage cream into incision for one minute with each application.  Use sunscreen (50 SPF or higher) for first 6 months after surgery if area is exposed to sun.  You may substitute Mederma or other scar reducing creams as desired.   

## 2012-01-27 ENCOUNTER — Encounter (INDEPENDENT_AMBULATORY_CARE_PROVIDER_SITE_OTHER): Payer: Self-pay | Admitting: General Surgery

## 2012-01-27 ENCOUNTER — Telehealth (INDEPENDENT_AMBULATORY_CARE_PROVIDER_SITE_OTHER): Payer: Self-pay | Admitting: General Surgery

## 2012-01-27 NOTE — Telephone Encounter (Signed)
Patient requests a return to work note for 01/08/2012. Note written and at the front for pick up per patient's wife.

## 2012-02-25 ENCOUNTER — Encounter (INDEPENDENT_AMBULATORY_CARE_PROVIDER_SITE_OTHER): Payer: BC Managed Care – PPO | Admitting: Surgery

## 2012-03-01 ENCOUNTER — Telehealth (INDEPENDENT_AMBULATORY_CARE_PROVIDER_SITE_OTHER): Payer: Self-pay

## 2012-03-01 NOTE — Telephone Encounter (Signed)
LMOM time to get labs at [facility]. If pt calls I can fax lab slip to [facility] if he has lost it.

## 2012-03-23 ENCOUNTER — Encounter (HOSPITAL_COMMUNITY): Payer: Self-pay | Admitting: *Deleted

## 2012-03-23 ENCOUNTER — Observation Stay (HOSPITAL_COMMUNITY)
Admission: EM | Admit: 2012-03-23 | Discharge: 2012-03-25 | Disposition: A | Discharge status: Home / Self Care | Payer: BC Managed Care – PPO | Attending: Surgery | Admitting: Surgery

## 2012-03-23 ENCOUNTER — Emergency Department (HOSPITAL_COMMUNITY): Payer: BC Managed Care – PPO

## 2012-03-23 DIAGNOSIS — Z23 Encounter for immunization: Secondary | ICD-10-CM | POA: Insufficient documentation

## 2012-03-23 DIAGNOSIS — K819 Cholecystitis, unspecified: Secondary | ICD-10-CM

## 2012-03-23 DIAGNOSIS — K8000 Calculus of gallbladder with acute cholecystitis without obstruction: Principal | ICD-10-CM | POA: Insufficient documentation

## 2012-03-23 DIAGNOSIS — I10 Essential (primary) hypertension: Secondary | ICD-10-CM | POA: Insufficient documentation

## 2012-03-23 DIAGNOSIS — R1011 Right upper quadrant pain: Secondary | ICD-10-CM | POA: Insufficient documentation

## 2012-03-23 LAB — COMPREHENSIVE METABOLIC PANEL
ALT: 25 U/L (ref 0–53)
Alkaline Phosphatase: 63 U/L (ref 39–117)
CO2: 24 mEq/L (ref 19–32)
Calcium: 10.1 mg/dL (ref 8.4–10.5)
GFR calc Af Amer: 79 mL/min — ABNORMAL LOW (ref >90)
GFR calc non Af Amer: 69 mL/min — ABNORMAL LOW (ref >90)
Glucose, Bld: 138 mg/dL — ABNORMAL HIGH (ref 70–99)
Sodium: 141 mEq/L (ref 135–145)
Total Bilirubin: 0.8 mg/dL (ref 0.3–1.2)

## 2012-03-23 LAB — CBC WITH DIFFERENTIAL/PLATELET
Eosinophils Relative: 1 % (ref 0–5)
HCT: 40.7 % (ref 39.0–52.0)
Lymphocytes Relative: 37 % (ref 12–46)
Lymphs Abs: 2.7 10*3/uL (ref 0.7–4.0)
MCV: 86.4 fL (ref 78.0–100.0)
Platelets: 227 10*3/uL (ref 150–400)
RBC: 4.71 MIL/uL (ref 4.22–5.81)
WBC: 7.5 10*3/uL (ref 4.0–10.5)

## 2012-03-23 LAB — URINALYSIS, ROUTINE W REFLEX MICROSCOPIC
Bilirubin Urine: NEGATIVE
Hgb urine dipstick: NEGATIVE
Ketones, ur: NEGATIVE mg/dL
Protein, ur: NEGATIVE mg/dL
Urobilinogen, UA: 0.2 mg/dL (ref 0.0–1.0)

## 2012-03-23 MED ORDER — HYDROMORPHONE HCL PF 1 MG/ML IJ SOLN
1.0000 mg | INTRAMUSCULAR | Status: DC | PRN
  Administered 2012-03-23 – 2012-03-25 (×7): 1 mg via INTRAVENOUS
  Filled 2012-03-23 (×7): qty 1

## 2012-03-23 MED ORDER — FENTANYL CITRATE 0.05 MG/ML IJ SOLN
INTRAMUSCULAR | Status: AC
  Filled 2012-03-23: qty 2

## 2012-03-23 MED ORDER — ONDANSETRON HCL 4 MG/2ML IJ SOLN
4.0000 mg | Freq: Once | INTRAMUSCULAR | Status: AC
  Administered 2012-03-23 (×2): 4 mg via INTRAVENOUS
  Filled 2012-03-23: qty 2

## 2012-03-23 MED ORDER — ONDANSETRON HCL 4 MG/2ML IJ SOLN
INTRAMUSCULAR | Status: AC
  Administered 2012-03-23: 4 mg via INTRAVENOUS
  Filled 2012-03-23: qty 2

## 2012-03-23 MED ORDER — DEXTROSE 5 % IV SOLN
1.0000 g | Freq: Once | INTRAVENOUS | Status: AC
  Administered 2012-03-23: 1 g via INTRAVENOUS
  Filled 2012-03-23: qty 10

## 2012-03-23 MED ORDER — FENTANYL CITRATE 0.05 MG/ML IJ SOLN
50.0000 ug | Freq: Once | INTRAMUSCULAR | Status: AC
  Administered 2012-03-23: 50 ug via INTRAVENOUS

## 2012-03-23 MED ORDER — ONDANSETRON HCL 4 MG/2ML IJ SOLN: 4.0000 mg | Freq: Four times a day (QID) | INTRAMUSCULAR | Status: DC | PRN

## 2012-03-23 MED ORDER — HYDROMORPHONE HCL PF 1 MG/ML IJ SOLN
1.0000 mg | Freq: Once | INTRAMUSCULAR | Status: AC
  Administered 2012-03-23: 1 mg via INTRAVENOUS
  Filled 2012-03-23: qty 1

## 2012-03-23 MED ORDER — PIPERACILLIN-TAZOBACTAM 3.375 G IVPB
3.3750 g | Freq: Three times a day (TID) | INTRAVENOUS | Status: DC
  Administered 2012-03-23 – 2012-03-24 (×2): 3.375 g via INTRAVENOUS
  Filled 2012-03-23 (×4): qty 50

## 2012-03-23 MED ORDER — SODIUM CHLORIDE 0.9 % IV SOLN
INTRAVENOUS | Status: AC
  Administered 2012-03-23: 23:00:00 via INTRAVENOUS

## 2012-03-23 MED ORDER — ONDANSETRON HCL 4 MG/2ML IJ SOLN: 4.0000 mg | Freq: Once | INTRAMUSCULAR | Status: DC

## 2012-03-23 MED ORDER — BENAZEPRIL HCL 20 MG PO TABS
20.0000 mg | ORAL_TABLET | Freq: Every day | ORAL | Status: DC
  Administered 2012-03-24 – 2012-03-25 (×2): 20 mg via ORAL
  Filled 2012-03-23 (×3): qty 1

## 2012-03-23 MED ORDER — HYDROMORPHONE HCL PF 1 MG/ML IJ SOLN: 1.0000 mg | INTRAMUSCULAR | Status: DC | PRN

## 2012-03-23 MED ORDER — DEXTROSE IN LACTATED RINGERS 5 % IV SOLN: INTRAVENOUS | Status: DC

## 2012-03-23 MED ORDER — SODIUM CHLORIDE 0.9 % IV BOLUS (SEPSIS)
1000.0000 mL | Freq: Once | INTRAVENOUS | Status: AC
  Administered 2012-03-23: 1000 mL via INTRAVENOUS

## 2012-03-23 MED ORDER — ALLOPURINOL 100 MG PO TABS
100.0000 mg | ORAL_TABLET | Freq: Every day | ORAL | Status: DC
Start: 2012-03-24 — End: 2012-03-24
  Filled 2012-03-23: qty 1

## 2012-03-23 MED ORDER — CITALOPRAM HYDROBROMIDE 10 MG PO TABS
20.0000 mg | ORAL_TABLET | Freq: Every day | ORAL | Status: DC
  Filled 2012-03-23: qty 2

## 2012-03-23 NOTE — ED Provider Notes (Signed)
History     CSN: [id number]  Arrival date & time 03/23/12  1719   First MD Initiated Contact with Patient 03/23/12 1756      Chief Complaint  Patient presents with  . RUQ Pain     (Consider location/radiation/quality/duration/timing/severity/associated sxs/prior treatment) Patient is a 56 y.o. male presenting with general illness. The history is provided by the patient. No language interpreter was used.  Illness  The current episode started today. The problem occurs continuously. The problem has been unchanged. The problem is severe. Nothing relieves the symptoms. Nothing aggravates the symptoms. Associated symptoms include abdominal pain, diarrhea and nausea. Pertinent negatives include no fever, no constipation, no vomiting, no congestion, no headaches, no sore throat, no cough and no rash.    Past Medical History  Diagnosis Date  . History of gout   . Hypercalcemia   . Depression   . Hypertension   . Anxiety   . Allergy   . IBS (irritable bowel syndrome)   . Thyroid disease   . Headache     due to cervical disc problem  . History of kidney stones 2002  . Elevated bilirubin   . Sleep apnea     mild doses not use c-pap    Past Surgical History  Procedure Date  . Mole removal 2000  . Colonoscopy 2010  . Parathyroidectomy 12/25/2011    Procedure: PARATHYROIDECTOMY;  Surgeon: Velora Heckler, MD;  Location: [facility];  Service: General;  Laterality: N/A;    No family history on file.  History  Substance Use Topics  . Smoking status: Former Games developer  . Smokeless tobacco: Former Neurosurgeon    Quit date: 10/17/1986  . Alcohol Use: Yes     Comment: occasional      Review of Systems  Constitutional: Negative for fever and chills.  HENT: Negative for congestion and sore throat.   Respiratory: Negative for cough and shortness of breath.   Cardiovascular: Negative for chest pain and leg swelling.  Gastrointestinal: Positive for nausea, abdominal pain and diarrhea. Negative  for vomiting and constipation.  Genitourinary: Negative for dysuria and frequency.  Skin: Negative for color change and rash.  Neurological: Negative for dizziness and headaches.  Psychiatric/Behavioral: Negative for confusion and agitation.  All other systems reviewed and are negative.    Allergies  Review of patient's allergies indicates no known allergies.  Home Medications   Current Outpatient Rx  Name  Route  Sig  Dispense  Refill  . ACETAMINOPHEN 500 MG PO TABS   Oral   Take 500 mg by mouth every 6 (six) hours as needed. For pain         . ALLOPURINOL 100 MG PO TABS   Oral   Take 100 mg by mouth daily.         Marland Kitchen BENAZEPRIL HCL 40 MG PO TABS   Oral   Take 20 mg by mouth daily with breakfast. Takes 1/2 tablet of 40mg  twice daily         . CITALOPRAM HYDROBROMIDE 20 MG PO TABS   Oral   Take 20 mg by mouth daily.           BP 117/73  Pulse 60  Resp 24  SpO2 97%  Physical Exam  Vitals reviewed. Constitutional: He is oriented to person, place, and time. He appears well-developed and well-nourished. No distress.  HENT:  Head: Normocephalic and atraumatic.  Eyes: EOM are normal. Pupils are equal, round, and reactive to light.  Neck:  Normal range of motion. Neck supple.  Cardiovascular: Normal rate and regular rhythm.   Pulmonary/Chest: Effort normal. No respiratory distress.  Abdominal: Soft. He exhibits no distension. There is tenderness in the right upper quadrant. There is positive Murphy's sign. There is no rigidity, no rebound, no guarding and no tenderness at McBurney's point.    Musculoskeletal: Normal range of motion. He exhibits no edema.  Neurological: He is alert and oriented to person, place, and time.  Skin: Skin is warm and dry.  Psychiatric: He has a normal mood and affect. His behavior is normal.    ED Course  Procedures (including critical care time)   Labs Reviewed  CBC WITH DIFFERENTIAL  COMPREHENSIVE METABOLIC PANEL  LIPASE,  BLOOD  URINALYSIS, ROUTINE W REFLEX MICROSCOPIC   Results for orders placed during the hospital encounter of 03/23/12  CBC WITH DIFFERENTIAL      Component Value Range   WBC 7.5  4.0 - 10.5 K/uL   RBC 4.71  4.22 - 5.81 MIL/uL   Hemoglobin 14.2  13.0 - 17.0 g/dL   HCT 19.1  47.8 - 29.5 %   MCV 86.4  78.0 - 100.0 fL   MCH 30.1  26.0 - 34.0 pg   MCHC 34.9  30.0 - 36.0 g/dL   RDW 62.1  30.8 - 65.7 %   Platelets 227  150 - 400 K/uL   Neutrophils Relative 54  43 - 77 %   Neutro Abs 4.0  1.7 - 7.7 K/uL   Lymphocytes Relative 37  12 - 46 %   Lymphs Abs 2.7  0.7 - 4.0 K/uL   Monocytes Relative 8  3 - 12 %   Monocytes Absolute 0.6  0.1 - 1.0 K/uL   Eosinophils Relative 1  0 - 5 %   Eosinophils Absolute 0.1  0.0 - 0.7 K/uL   Basophils Relative 0  0 - 1 %   Basophils Absolute 0.0  0.0 - 0.1 K/uL  COMPREHENSIVE METABOLIC PANEL      Component Value Range   Sodium 141  135 - 145 mEq/L   Potassium 3.5  3.5 - 5.1 mEq/L   Chloride 103  96 - 112 mEq/L   CO2 24  19 - 32 mEq/L   Glucose, Bld 138 (*) 70 - 99 mg/dL   BUN 14  6 - 23 mg/dL   Creatinine, Ser 8.46  0.50 - 1.35 mg/dL   Calcium 96.2  8.4 - 95.2 mg/dL   Total Protein 7.5  6.0 - 8.3 g/dL   Albumin 4.1  3.5 - 5.2 g/dL   AST 20  0 - 37 U/L   ALT 25  0 - 53 U/L   Alkaline Phosphatase 63  39 - 117 U/L   Total Bilirubin 0.8  0.3 - 1.2 mg/dL   GFR calc non Af Amer 69 (*) >90 mL/min   GFR calc Af Amer 79 (*) >90 mL/min  LIPASE, BLOOD      Component Value Range   Lipase 29  11 - 59 U/L  URINALYSIS, ROUTINE W REFLEX MICROSCOPIC      Component Value Range   Color, Urine YELLOW  YELLOW   APPearance CLEAR  CLEAR   Specific Gravity, Urine 1.026  1.005 - 1.030   pH 6.5  5.0 - 8.0   Glucose, UA NEGATIVE  NEGATIVE mg/dL   Hgb urine dipstick NEGATIVE  NEGATIVE   Bilirubin Urine NEGATIVE  NEGATIVE   Ketones, ur NEGATIVE  NEGATIVE mg/dL   Protein,  ur NEGATIVE  NEGATIVE mg/dL   Urobilinogen, UA 0.2  0.0 - 1.0 mg/dL   Nitrite NEGATIVE   NEGATIVE   Leukocytes, UA NEGATIVE  NEGATIVE  SURGICAL PCR SCREEN      Component Value Range   MRSA, PCR NEGATIVE  NEGATIVE   Staphylococcus aureus NEGATIVE  NEGATIVE   US Abdomen Complete (Final result)   Result time:03/23/12 1929    Final result by Rad Results In Interface (03/23/12 19:29:34)    Narrative:   *RADIOLOGY REPORT*  Clinical Data: Right upper quadrant pain.  COMPLETE ABDOMINAL ULTRASOUND  Comparison: 01/26/2009.  Findings:  Gallbladder: Small gallstones are present, measuring about 5 mm. Gallbladder wall measures 4 mm. Sonographic Murphy's sign is present.  Common bile duct: 4 mm, normal.  Liver: Echogenic compatible with hepatic steatosis.  IVC: Appears normal.  Pancreas: Poorly visualized due to overlying bowel gas.  Spleen: Splenomegaly is present with 16.3 cm splenic span. Splenic volume is 853 ml.  Right Kidney: 10.6 cm. Normal echotexture. Normal central sinus echo complex. No calculi or hydronephrosis.  Left Kidney: 10.6 cm. Normal echotexture. Normal central sinus echo complex. No calculi or hydronephrosis.  Abdominal aorta: 2.5 cm.  IMPRESSION: Cholelithiasis with positive sonographic Murphy's sign highly suggestive of early acute cholecystitis.  Fatty liver and hepatosplenomegaly.   Original Report Authenticated By: Andreas Newport, M.D.            No results found.   No diagnosis found.    MDM  Pt w/ pmhx of recurrent RUQ abd pain now w/ abdominal pain. States sx started 1.5 hrs ago. Severe burning, radiating to right side of back. A/w multiple episodes of NB/NB emesis. No fever, no jaundice, no urinary sx or constipation/diarrhea. No ETOH, tobacco or NSAIDS.   Exam ttp RUQ and epigastric, neg murphy, no CVA tenderness, abd soft, no clinical peritonitis.   Plan: concern for biliary etiology - cholecystitis or choledocholithiasis, doubt cholangitis. Possible pancreatitis, PUD or gastritis. Doubt perforated viscus, SBO,  mesenteric ischemia. Will check RUQ [place], cbc, cmp, lipase, u/a, will give dilaudid, zofran and 1L IVF    Course: US reveals 4mm GBW thickening w/ gall stones - c/w acute cholecystitis. Labs unremarkable, given rocephin and consulted surgery. Pt admitted to surgery in stable condition.   1. Cholecystitis            Audelia Hives, MD 03/24/12 [phone]

## 2012-03-23 NOTE — H&P (Signed)
Wayne Dixon is an 56 y.o. male.   Chief Complaint: RUQ pain HPI: 56 y/o M with RUQ that started this PM.  He has had some pain prev 6-8 mo ago.  Some nausea no emesis.    Past Medical History  Diagnosis Date  . History of gout   . Hypercalcemia   . Depression   . Hypertension   . Anxiety   . Allergy   . IBS (irritable bowel syndrome)   . Thyroid disease   . Headache     due to cervical disc problem  . History of kidney stones 2002  . Elevated bilirubin   . Sleep apnea     mild doses not use c-pap    Past Surgical History  Procedure Date  . Mole removal 2000  . Colonoscopy 2010  . Parathyroidectomy 12/25/2011    Procedure: PARATHYROIDECTOMY;  Surgeon: Velora Heckler, MD;  Location: [facility];  Service: General;  Laterality: N/A;    No family history on file. Social History:  reports that he has quit smoking. He quit smokeless tobacco use about 25 years ago. He reports that he drinks alcohol. He reports that he does not use illicit drugs.  Allergies: No Known Allergies   (Not in a hospital admission)  Results for orders placed during the hospital encounter of 03/23/12 (from the past 48 hour(s))  CBC WITH DIFFERENTIAL     Status: Normal   Collection Time   03/23/12  5:32 PM      Component Value Range Comment   WBC 7.5  4.0 - 10.5 K/uL    RBC 4.71  4.22 - 5.81 MIL/uL    Hemoglobin 14.2  13.0 - 17.0 g/dL    HCT 45.4  09.8 - 11.9 %    MCV 86.4  78.0 - 100.0 fL    MCH 30.1  26.0 - 34.0 pg    MCHC 34.9  30.0 - 36.0 g/dL    RDW 14.7  82.9 - 56.2 %    Platelets 227  150 - 400 K/uL    Neutrophils Relative 54  43 - 77 %    Neutro Abs 4.0  1.7 - 7.7 K/uL    Lymphocytes Relative 37  12 - 46 %    Lymphs Abs 2.7  0.7 - 4.0 K/uL    Monocytes Relative 8  3 - 12 %    Monocytes Absolute 0.6  0.1 - 1.0 K/uL    Eosinophils Relative 1  0 - 5 %    Eosinophils Absolute 0.1  0.0 - 0.7 K/uL    Basophils Relative 0  0 - 1 %    Basophils Absolute 0.0  0.0 - 0.1 K/uL   COMPREHENSIVE  METABOLIC PANEL     Status: Abnormal   Collection Time   03/23/12  5:32 PM      Component Value Range Comment   Sodium 141  135 - 145 mEq/L    Potassium 3.5  3.5 - 5.1 mEq/L    Chloride 103  96 - 112 mEq/L    CO2 24  19 - 32 mEq/L    Glucose, Bld 138 (*) 70 - 99 mg/dL    BUN 14  6 - 23 mg/dL    Creatinine, Ser 1.30  0.50 - 1.35 mg/dL    Calcium 86.5  8.4 - 10.5 mg/dL    Total Protein 7.5  6.0 - 8.3 g/dL    Albumin 4.1  3.5 - 5.2 g/dL    AST  20  0 - 37 U/L    ALT 25  0 - 53 U/L    Alkaline Phosphatase 63  39 - 117 U/L    Total Bilirubin 0.8  0.3 - 1.2 mg/dL    GFR calc non Af Amer 69 (*) >90 mL/min    GFR calc Af Amer 79 (*) >90 mL/min   LIPASE, BLOOD     Status: Normal   Collection Time   03/23/12  5:32 PM      Component Value Range Comment   Lipase 29  11 - 59 U/L   URINALYSIS, ROUTINE W REFLEX MICROSCOPIC     Status: Normal   Collection Time   03/23/12  8:09 PM      Component Value Range Comment   Color, Urine YELLOW  YELLOW    APPearance CLEAR  CLEAR    Specific Gravity, Urine 1.026  1.005 - 1.030    pH 6.5  5.0 - 8.0    Glucose, UA NEGATIVE  NEGATIVE mg/dL    Hgb urine dipstick NEGATIVE  NEGATIVE    Bilirubin Urine NEGATIVE  NEGATIVE    Ketones, ur NEGATIVE  NEGATIVE mg/dL    Protein, ur NEGATIVE  NEGATIVE mg/dL    Urobilinogen, UA 0.2  0.0 - 1.0 mg/dL    Nitrite NEGATIVE  NEGATIVE    Leukocytes, UA NEGATIVE  NEGATIVE MICROSCOPIC NOT DONE ON URINES WITH NEGATIVE PROTEIN, BLOOD, LEUKOCYTES, NITRITE, OR GLUCOSE <1000 mg/dL.   US Abdomen Complete  03/23/2012  *RADIOLOGY REPORT*  Clinical Data:  Right upper quadrant pain.  COMPLETE ABDOMINAL ULTRASOUND  Comparison:  01/26/2009.  Findings:  Gallbladder:  Small gallstones are present, measuring about 5 mm. Gallbladder wall measures 4 mm.  Sonographic Murphy's sign is present.  Common bile duct:  4 mm, normal.  Liver:  Echogenic compatible with hepatic steatosis.  IVC:  Appears normal.  Pancreas:  Poorly visualized due to  overlying bowel gas.  Spleen:  Splenomegaly is present with 16.3 cm splenic span. Splenic volume is 853 ml.  Right Kidney:  10.6 cm. Normal echotexture.  Normal central sinus echo complex.  No calculi or hydronephrosis.  Left Kidney:  10.6 cm. Normal echotexture.  Normal central sinus echo complex.  No calculi or hydronephrosis.  Abdominal aorta:  2.5 cm.  IMPRESSION: Cholelithiasis with positive sonographic Murphy's sign highly suggestive of early acute cholecystitis.  Fatty liver and hepatosplenomegaly.   Original Report Authenticated By: Andreas Newport, M.D.     Review of Systems  Constitutional: Negative for fever and chills.  HENT: Negative.   Eyes: Negative.   Respiratory: Negative.   Cardiovascular: Negative.   Gastrointestinal: Positive for nausea and abdominal pain. Negative for vomiting.  Musculoskeletal: Negative.   Skin: Negative.     Blood pressure 136/78, pulse 51, resp. rate 14, SpO2 93.00%. Physical Exam  Constitutional: He is oriented to person, place, and time. He appears well-developed and well-nourished.  HENT:  Head: Normocephalic and atraumatic.  Eyes: Conjunctivae normal and EOM are normal. Pupils are equal, round, and reactive to light.  Neck: Normal range of motion.  Cardiovascular: Normal rate and normal heart sounds.   Respiratory: Effort normal and breath sounds normal.  GI: Soft. There is tenderness (ruq). There is no rebound and no guarding.  Musculoskeletal: Normal range of motion.  Neurological: He is alert and oriented to person, place, and time.     Assessment/Plan 56 y/o M with acute cholecystitis per exam and [place]  -Admit to 6N -IV abx -Consent for  lap chole with ioc -IVF  Marigene Ehlers., [facility] 03/23/2012, 9:14 PM

## 2012-03-23 NOTE — ED Notes (Signed)
Pt presents to department for evaluation of RUQ abdominal pain. Onset today. Also states nausea/vomiting. 10/10 pain upon arrival. Pt grimacing in pain. States recently getting over stomach bug. Bowel sounds present all quadrants. RUQ tender to palpation. Denies urinary symptoms. Last BM today was normal. He is alert and oriented x4.

## 2012-03-23 NOTE — ED Notes (Signed)
Pt is here with right upper quad abdominal pain and has gallbladder issues.  Pt states pain started one hour ago and has vomited 4 times

## 2012-03-23 NOTE — ED Notes (Signed)
Patient currently sitting up in bed; no respiratory or acute distress noted.  Family present at bedside.  Patietn updated on plan of care; informed patient that primary RN is currently calling report.  Patient denies any other needs at this time; patient medicated for pain.  Will continue to monitor; preparing patient for transport.

## 2012-03-23 NOTE — ED Notes (Signed)
Pt ambulated from RR pt sts feeling nauseous. Delo MD notified. zofran given

## 2012-03-24 ENCOUNTER — Observation Stay (HOSPITAL_COMMUNITY): Payer: BC Managed Care – PPO | Admitting: Certified Registered Nurse Anesthetist

## 2012-03-24 ENCOUNTER — Encounter (HOSPITAL_COMMUNITY): Admission: EM | Disposition: A | Discharge status: Home / Self Care | Payer: Self-pay | Source: Home / Self Care

## 2012-03-24 ENCOUNTER — Encounter (HOSPITAL_COMMUNITY): Payer: Self-pay | Admitting: Certified Registered Nurse Anesthetist

## 2012-03-24 DIAGNOSIS — K8010 Calculus of gallbladder with chronic cholecystitis without obstruction: Secondary | ICD-10-CM

## 2012-03-24 HISTORY — PX: CHOLECYSTECTOMY: SHX55

## 2012-03-24 LAB — COMPREHENSIVE METABOLIC PANEL
Alkaline Phosphatase: 64 U/L (ref 39–117)
BUN: 13 mg/dL (ref 6–23)
Chloride: 100 mEq/L (ref 96–112)
GFR calc Af Amer: 88 mL/min — ABNORMAL LOW (ref >90)
GFR calc non Af Amer: 76 mL/min — ABNORMAL LOW (ref >90)
Glucose, Bld: 130 mg/dL — ABNORMAL HIGH (ref 70–99)
Potassium: 4 mEq/L (ref 3.5–5.1)
Total Bilirubin: 1.1 mg/dL (ref 0.3–1.2)

## 2012-03-24 LAB — SURGICAL PCR SCREEN: MRSA, PCR: NEGATIVE

## 2012-03-24 SURGERY — LAPAROSCOPIC CHOLECYSTECTOMY WITH INTRAOPERATIVE CHOLANGIOGRAM
Anesthesia: General | Site: Abdomen | Wound class: Contaminated

## 2012-03-24 MED ORDER — ROCURONIUM BROMIDE 100 MG/10ML IV SOLN
INTRAVENOUS | Status: DC | PRN
  Administered 2012-03-24: 10 mg via INTRAVENOUS
  Administered 2012-03-24: 40 mg via INTRAVENOUS

## 2012-03-24 MED ORDER — BUPIVACAINE-EPINEPHRINE PF 0.25-1:200000 % IJ SOLN
INTRAMUSCULAR | Status: AC
  Filled 2012-03-24: qty 30

## 2012-03-24 MED ORDER — HEMOSTATIC AGENTS (NO CHARGE) OPTIME
TOPICAL | Status: DC | PRN
  Administered 2012-03-24 (×2): 1 via TOPICAL

## 2012-03-24 MED ORDER — OXYCODONE HCL 5 MG PO TABS: 5.0000 mg | ORAL_TABLET | Freq: Once | ORAL | Status: DC | PRN

## 2012-03-24 MED ORDER — PIPERACILLIN-TAZOBACTAM 3.375 G IVPB
3.3750 g | Freq: Three times a day (TID) | INTRAVENOUS | Status: DC
  Administered 2012-03-24 – 2012-03-25 (×2): 3.375 g via INTRAVENOUS
  Filled 2012-03-24 (×3): qty 50

## 2012-03-24 MED ORDER — FENTANYL CITRATE 0.05 MG/ML IJ SOLN
INTRAMUSCULAR | Status: DC | PRN
  Administered 2012-03-24 (×2): 75 ug via INTRAVENOUS
  Administered 2012-03-24: 50 ug via INTRAVENOUS
  Administered 2012-03-24: 150 ug via INTRAVENOUS

## 2012-03-24 MED ORDER — LIDOCAINE HCL (CARDIAC) 20 MG/ML IV SOLN
INTRAVENOUS | Status: DC | PRN
  Administered 2012-03-24: 100 mg via INTRAVENOUS

## 2012-03-24 MED ORDER — ACETAMINOPHEN 650 MG RE SUPP
650.0000 mg | Freq: Four times a day (QID) | RECTAL | Status: DC | PRN
  Filled 2012-03-24: qty 1

## 2012-03-24 MED ORDER — LACTATED RINGERS IV SOLN
INTRAVENOUS | Status: DC | PRN
  Administered 2012-03-24 (×2): via INTRAVENOUS

## 2012-03-24 MED ORDER — GLYCOPYRROLATE 0.2 MG/ML IJ SOLN
INTRAMUSCULAR | Status: DC | PRN
  Administered 2012-03-24: 0.6 mg via INTRAVENOUS

## 2012-03-24 MED ORDER — SODIUM CHLORIDE 0.9 % IV SOLN
INTRAVENOUS | Status: DC
  Administered 2012-03-24 – 2012-03-25 (×2): 1000 mL via INTRAVENOUS

## 2012-03-24 MED ORDER — BUPIVACAINE-EPINEPHRINE 0.25% -1:200000 IJ SOLN
INTRAMUSCULAR | Status: DC | PRN
  Administered 2012-03-24: 19 mL

## 2012-03-24 MED ORDER — HYDROMORPHONE HCL PF 1 MG/ML IJ SOLN
0.2500 mg | INTRAMUSCULAR | Status: DC | PRN
  Administered 2012-03-24 (×2): 0.5 mg via INTRAVENOUS

## 2012-03-24 MED ORDER — ONDANSETRON HCL 4 MG/2ML IJ SOLN
4.0000 mg | Freq: Four times a day (QID) | INTRAMUSCULAR | Status: DC | PRN
  Filled 2012-03-24: qty 2

## 2012-03-24 MED ORDER — OXYCODONE HCL 5 MG PO TABS
5.0000 mg | ORAL_TABLET | ORAL | Status: DC | PRN
  Administered 2012-03-24 – 2012-03-25 (×3): 5 mg via ORAL
  Filled 2012-03-24 (×3): qty 1

## 2012-03-24 MED ORDER — 0.9 % SODIUM CHLORIDE (POUR BTL) OPTIME
TOPICAL | Status: DC | PRN
  Administered 2012-03-24: 1000 mL

## 2012-03-24 MED ORDER — MIDAZOLAM HCL 5 MG/5ML IJ SOLN
INTRAMUSCULAR | Status: DC | PRN
  Administered 2012-03-24: 2 mg via INTRAVENOUS

## 2012-03-24 MED ORDER — SUCCINYLCHOLINE CHLORIDE 20 MG/ML IJ SOLN
INTRAMUSCULAR | Status: DC | PRN
  Administered 2012-03-24: 140 mg via INTRAVENOUS

## 2012-03-24 MED ORDER — OXYCODONE HCL 5 MG/5ML PO SOLN: 5.0000 mg | Freq: Once | ORAL | Status: DC | PRN

## 2012-03-24 MED ORDER — PROMETHAZINE HCL 25 MG/ML IJ SOLN: 6.2500 mg | INTRAMUSCULAR | Status: DC | PRN

## 2012-03-24 MED ORDER — PROPOFOL 10 MG/ML IV BOLUS
INTRAVENOUS | Status: DC | PRN
  Administered 2012-03-24: 200 mg via INTRAVENOUS

## 2012-03-24 MED ORDER — ACETAMINOPHEN 325 MG PO TABS
650.0000 mg | ORAL_TABLET | Freq: Four times a day (QID) | ORAL | Status: DC | PRN
  Filled 2012-03-24: qty 2

## 2012-03-24 MED ORDER — NEOSTIGMINE METHYLSULFATE 1 MG/ML IJ SOLN
INTRAMUSCULAR | Status: DC | PRN
  Administered 2012-03-24: 4 mg via INTRAVENOUS

## 2012-03-24 MED ORDER — SODIUM CHLORIDE 0.9 % IR SOLN
Status: DC | PRN
  Administered 2012-03-24: 1000 mL

## 2012-03-24 MED ORDER — INFLUENZA VIRUS VACC SPLIT PF IM SUSP
0.5000 mL | INTRAMUSCULAR | Status: AC
  Administered 2012-03-25: 0.5 mL via INTRAMUSCULAR
  Filled 2012-03-24: qty 0.5

## 2012-03-24 MED ORDER — EPHEDRINE SULFATE 50 MG/ML IJ SOLN
INTRAMUSCULAR | Status: DC | PRN
  Administered 2012-03-24 (×2): 10 mg via INTRAVENOUS

## 2012-03-24 SURGICAL SUPPLY — 37 items
APPLIER CLIP 5 13 M/L LIGAMAX5 (MISCELLANEOUS) ×2
BLADE SURG ROTATE 9660 (MISCELLANEOUS) ×2 IMPLANT
CANISTER SUCTION 2500CC (MISCELLANEOUS) ×2 IMPLANT
CHLORAPREP W/TINT 26ML (MISCELLANEOUS) ×2 IMPLANT
CLIP APPLIE 5 13 M/L LIGAMAX5 (MISCELLANEOUS) ×1 IMPLANT
CLOTH BEACON ORANGE TIMEOUT ST (SAFETY) ×2 IMPLANT
COVER MAYO STAND STRL (DRAPES) IMPLANT
COVER SURGICAL LIGHT HANDLE (MISCELLANEOUS) ×2 IMPLANT
DECANTER SPIKE VIAL GLASS SM (MISCELLANEOUS) IMPLANT
DERMABOND ADVANCED (GAUZE/BANDAGES/DRESSINGS)
DERMABOND ADVANCED .7 DNX12 (GAUZE/BANDAGES/DRESSINGS) IMPLANT
DRAPE C-ARM 42X72 X-RAY (DRAPES) IMPLANT
ELECT REM PT RETURN 9FT ADLT (ELECTROSURGICAL) ×2
ELECTRODE REM PT RTRN 9FT ADLT (ELECTROSURGICAL) ×1 IMPLANT
GLOVE BIO SURGEON STRL SZ7 (GLOVE) ×4 IMPLANT
GLOVE BIOGEL PI IND STRL 7.5 (GLOVE) ×1 IMPLANT
GLOVE BIOGEL PI INDICATOR 7.5 (GLOVE) ×1
GOWN PREVENTION PLUS XLARGE (GOWN DISPOSABLE) ×2 IMPLANT
GOWN STRL NON-REIN LRG LVL3 (GOWN DISPOSABLE) ×8 IMPLANT
HEMOSTAT SNOW SURGICEL 2X4 (HEMOSTASIS) ×4 IMPLANT
KIT BASIN OR (CUSTOM PROCEDURE TRAY) ×2 IMPLANT
KIT ROOM TURNOVER OR (KITS) ×2 IMPLANT
NS IRRIG 1000ML POUR BTL (IV SOLUTION) ×2 IMPLANT
PAD ARMBOARD 7.5X6 YLW CONV (MISCELLANEOUS) ×2 IMPLANT
POUCH SPECIMEN RETRIEVAL 10MM (ENDOMECHANICALS) ×2 IMPLANT
SCISSORS LAP 5X35 DISP (ENDOMECHANICALS) ×2 IMPLANT
SET CHOLANGIOGRAPH 5 50 .035 (SET/KITS/TRAYS/PACK) IMPLANT
SET IRRIG TUBING LAPAROSCOPIC (IRRIGATION / IRRIGATOR) ×2 IMPLANT
SLEEVE ENDOPATH XCEL 5M (ENDOMECHANICALS) ×4 IMPLANT
SPECIMEN JAR MEDIUM (MISCELLANEOUS) ×2 IMPLANT
SPECIMEN JAR SMALL (MISCELLANEOUS) IMPLANT
SUT MNCRL AB 4-0 PS2 18 (SUTURE) ×2 IMPLANT
TOWEL OR 17X24 6PK STRL BLUE (TOWEL DISPOSABLE) ×2 IMPLANT
TOWEL OR 17X26 10 PK STRL BLUE (TOWEL DISPOSABLE) ×2 IMPLANT
TRAY LAPAROSCOPIC (CUSTOM PROCEDURE TRAY) ×2 IMPLANT
TROCAR XCEL BLUNT TIP 100MML (ENDOMECHANICALS) ×2 IMPLANT
TROCAR XCEL NON-BLD 5MMX100MML (ENDOMECHANICALS) ×2 IMPLANT

## 2012-03-24 NOTE — Op Note (Signed)
Preoperative diagnosis: Acute cholecystitis Postoperative diagnosis:  Same as above Procedure: Laparoscopic cholecystectomy Surgeon: Dr Harden Mo  Asst: Dr. Cicero Duck Anesthesia: General Specimen: gb and contents to pathology EBL: minimal Drains: none Complications: None Sponge and needle count correct x2 at end of operation Disposition to recovery in stable condition  Indications: This a 56 year old male who was admitted by one of my partners last night for acute cholecystitis. I examined him this morning and he does indeed appear to have acute cholecystitis with an ultrasound consistent with that as well. He and I discussed a laparoscopic cholecystectomy the risks and benefits associated with that.  Procedure: After informed consent was obtained the patient was taken to the operating room. He was administered Zosyn on the floor. He had sequential compression devices on his legs. He was placed under general anesthesia without complication. His abdomen was prepped and draped in the standard sterile surgical fashion. Surgical timeout was performed.  I infiltrated Marcaine below his umbilicus. I made an incision and carried this through the fascia. I entered his peritoneum bluntly. A 0 Vicryl pursestring suture was placed in the fascia. I then inserted a Hassan trocar and insufflated the abdomen to 15 mm mercury pressure I then inserted 3 further 5 mm trocars in the upper abdomen. These were all done under direct vision without complication. The gallbladder was then noted to have acute cholecystitis. I could not grasp it and I had to decompress it first. I then retracted cephalad and lateral. This was very scarred and it took some time to eventually obtain the critical view of safety. I was able to identify both the cystic duct and cystic artery. I did not do a cholangiogram due to the fact was very friable my duct was not very long. I then clipped both of these structures and divided them. I  then removed the gallbladder from the liver bed with some difficulty as it was very scarred in. I then placed an Endo Catch bag and removed from the umbilicus. I then obtained hemostasis. I did place 2 pieces of Surgicel over the liver bed. Irrigation was performed until this was clear. I then removed my umbilical trocar and tied this down and this completely obliterated the defect. There was no evidence of an entry injury. I then removed the remaining trocars and desufflated the abdomen. These were closed with 4-0 Monocryl and Dermabond. He tolerated this well as extubated and transferred to the recovery room in stable condition.

## 2012-03-24 NOTE — Anesthesia Postprocedure Evaluation (Signed)
  Anesthesia Post-op Note  Patient: Wayne Dixon  Procedure(s) Performed: Procedure(s) (LRB) with comments: LAPAROSCOPIC CHOLECYSTECTOMY WITH INTRAOPERATIVE CHOLANGIOGRAM (N/A) - laparascopic cholecystectomy  Patient Location: PACU  Anesthesia Type:General  Level of Consciousness: awake and alert   Airway and Oxygen Therapy: Patient Spontanous Breathing  Post-op Pain: mild  Post-op Assessment: Post-op Vital signs reviewed, Patient's Cardiovascular Status Stable, Respiratory Function Stable, Patent Airway, No signs of Nausea or vomiting and Pain level controlled  Post-op Vital Signs: stable  Complications: No apparent anesthesia complications

## 2012-03-24 NOTE — Preoperative (Signed)
Beta Blockers   Reason not to administer Beta Blockers:Not Applicable 

## 2012-03-24 NOTE — ED Provider Notes (Signed)
I saw and evaluated the patient, reviewed the resident's note and I agree with the findings and plan. The patient presents with a two hour history of right upper quadrant pain that started shortly after eating pizza.  He reports nausea and loose stools.  Denies fevers or chills.    On exam, the vitals are stable and the patient is afebrile.  He appears quite uncomfortable.  The heart and lung exam is unremarkable.  He is ttp in the right upper quadrant and epigastric region without rebound or guarding.    The workup reveals normal labs, however the US showed cholecystitis.  His pain has been treated and surgery has been consulted.  They will admit him.  Geoffery Lyons, MD 03/24/12 2000

## 2012-03-24 NOTE — Progress Notes (Signed)
  Subjective: Feels better this am  Objective: Vital signs in last 24 hours: Temp:  [97.2 F (36.2 C)-98.9 F (37.2 C)] 98.9 F (37.2 C) (12/25 0550) Pulse Rate:  [51-85] 85  (12/25 0550) Resp:  [11-24] 16  (12/25 0550) BP: (117-150)/(58-94) 138/86 mmHg (12/25 0550) SpO2:  [91 %-98 %] 91 % (12/25 0550) Weight:  [243 lb (110.224 kg)] 243 lb (110.224 kg) (12/24 2250) Last BM Date: 03/23/12  Intake/Output from previous day:   Intake/Output this shift:    GI: mild ruq tenderness  Lab Results:   [facility] 03/23/12 1732  WBC 7.5  HGB 14.2  HCT 40.7  PLT 227   BMET  Basename 03/24/12 0550 03/23/12 1732  NA 137 141  K 4.0 3.5  CL 100 103  CO2 24 24  GLUCOSE 130* 138*  BUN 13 14  CREATININE 1.07 1.17  CALCIUM 9.4 10.1   PT/INR No results found for this basename: LABPROT:2,INR:2 in the last 72 hours ABG No results found for this basename: PHART:2,PCO2:2,PO2:2,HCO3:2 in the last 72 hours  Studies/Results: US Abdomen Complete  03/23/2012  *RADIOLOGY REPORT*  Clinical Data:  Right upper quadrant pain.  COMPLETE ABDOMINAL ULTRASOUND  Comparison:  01/26/2009.  Findings:  Gallbladder:  Small gallstones are present, measuring about 5 mm. Gallbladder wall measures 4 mm.  Sonographic Murphy's sign is present.  Common bile duct:  4 mm, normal.  Liver:  Echogenic compatible with hepatic steatosis.  IVC:  Appears normal.  Pancreas:  Poorly visualized due to overlying bowel gas.  Spleen:  Splenomegaly is present with 16.3 cm splenic span. Splenic volume is 853 ml.  Right Kidney:  10.6 cm. Normal echotexture.  Normal central sinus echo complex.  No calculi or hydronephrosis.  Left Kidney:  10.6 cm. Normal echotexture.  Normal central sinus echo complex.  No calculi or hydronephrosis.  Abdominal aorta:  2.5 cm.  IMPRESSION: Cholelithiasis with positive sonographic Murphy's sign highly suggestive of early acute cholecystitis.  Fatty liver and hepatosplenomegaly.   Original Report  Authenticated By: Andreas Newport, M.D.     Anti-infectives:  Assessment/Plan: Cholecystitis I discussed the procedure in detail.  We discussed the risks and benefits of a laparoscopic cholecystectomy and possible cholangiogram including, but not limited to bleeding, infection, injury to surrounding structures such as the intestine or liver, bile leak,  need to convert to an open procedure, prolonged diarrhea, blood clots such as  DVT, common bile duct injury.  The likelihood of improvement in symptoms and return to the patient's normal status is good. We discussed the typical post-operative recovery course.   [facility] 03/24/2012

## 2012-03-24 NOTE — Transfer of Care (Signed)
Immediate Anesthesia Transfer of Care Note  Patient: Wayne Dixon  Procedure(s) Performed: Procedure(s) (LRB) with comments: LAPAROSCOPIC CHOLECYSTECTOMY WITH INTRAOPERATIVE CHOLANGIOGRAM (N/A) - laparascopic cholecystectomy  Patient Location: PACU  Anesthesia Type:General  Level of Consciousness: awake, alert  and oriented  Airway & Oxygen Therapy: Patient Spontanous Breathing and Patient connected to face mask oxygen  Post-op Assessment: Report given to PACU RN, Post -op Vital signs reviewed and stable and Patient moving all extremities  Post vital signs: Reviewed and stable  Complications: No apparent anesthesia complications

## 2012-03-24 NOTE — Anesthesia Preprocedure Evaluation (Addendum)
Anesthesia Evaluation  Patient identified by MRN, date of birth, ID band Patient awake    Reviewed: Allergy & Precautions, H&P , NPO status , Patient's Chart, lab work & pertinent test results  Airway Mallampati: III TM Distance: >3 FB Neck ROM: full    Dental  (+) Chipped and Dental Advisory Given,    Pulmonary sleep apnea ,  No CPAP.  Mild OSA per patient. breath sounds clear to auscultation  Pulmonary exam normal       Cardiovascular Exercise Tolerance: Good hypertension, Pt. on medications Rhythm:regular Rate:Normal     Neuro/Psych  Headaches, PSYCHIATRIC DISORDERS Anxiety Depression negative neurological ROS  negative psych ROS   GI/Hepatic negative GI ROS, Neg liver ROS,   Endo/Other  negative endocrine ROS  Renal/GU negative Renal ROS  negative genitourinary   Musculoskeletal   Abdominal (+) + obese,   Peds  Hematology negative hematology ROS (+)   Anesthesia Other Findings   Reproductive/Obstetrics negative OB ROS                          Anesthesia Physical Anesthesia Plan  ASA: III and emergent  Anesthesia Plan: General   Post-op Pain Management:    Induction: Intravenous, Rapid sequence and Cricoid pressure planned  Airway Management Planned: Oral ETT and Video Laryngoscope Planned  Additional Equipment:   Intra-op Plan:   Post-operative Plan: Extubation in OR  Informed Consent: I have reviewed the patients History and Physical, chart, labs and discussed the procedure including the risks, benefits and alternatives for the proposed anesthesia with the patient or authorized representative who has indicated his/her understanding and acceptance.     Plan Discussed with: CRNA and Surgeon  Anesthesia Plan Comments:         Anesthesia Quick Evaluation

## 2012-03-25 LAB — CBC
MCHC: 34 g/dL (ref 30.0–36.0)
Platelets: 153 10*3/uL (ref 150–400)
RDW: 13.3 % (ref 11.5–15.5)

## 2012-03-25 MED ORDER — INFLUENZA VIRUS VACC SPLIT PF IM SUSP: 0.5000 mL | INTRAMUSCULAR | Status: DC

## 2012-03-25 MED ORDER — AMOXICILLIN-POT CLAVULANATE 875-125 MG PO TABS: 1.0000 | ORAL_TABLET | Freq: Two times a day (BID) | ORAL | Status: DC

## 2012-03-25 MED ORDER — OXYCODONE HCL 5 MG PO TABS: 5.0000 mg | ORAL_TABLET | ORAL | Status: DC | PRN

## 2012-03-25 NOTE — Progress Notes (Signed)
Pt c/o mild wheezing in chest and SOB. VSS. Upon auscultation he has mild expiratory wheezes. He is breathing shallow and he states that "it hurts my belly to breathe." IS encouraged. Able to get the IS to 500. Went with pt for walk in halls and encouraged slow, deep breathing. After 1 lap around unit pt is sitting up in chair and saying that his "wheezing is better." Will continue to monitor.

## 2012-03-25 NOTE — Progress Notes (Signed)
UR completed 

## 2012-03-29 ENCOUNTER — Encounter (HOSPITAL_COMMUNITY): Payer: Self-pay | Admitting: General Surgery

## 2012-04-03 NOTE — Discharge Summary (Signed)
Physician Discharge Summary  Patient ID: Wayne Dixon MRN: [id number] DOB/AGE: July 22, 1955 57 y.o.  Admit date: 03/23/2012 Discharge date: 04/03/2012  Admission Diagnoses: Cholecystitis  Discharge Diagnoses:  Same as above  Discharged Condition: good  Hospital Course: 60 yom admitted with acute cholecystitis.  Underwent uncomplicated laparoscopic cholecystectomy.  The following day was tolerating diet, felt better, ambulating and pain controlled.  Consults: None  Significant Diagnostic Studies: none  Treatments: surgery: lap chole   Disposition: 01-Home or Self Care  Discharge Orders    Future Appointments: Provider: Department: Dept Phone: Center:   04/05/2012 2:00 PM Velora Heckler, MD [facility], [place] [phone] None     Future Orders Please Complete By Expires   Discharge patient      Comments:   To home self care  Patient to follow up with Dr. Dwain Sarna in 3 weeks time.       Medication List     As of 04/03/2012  6:12 PM    STOP taking these medications         acetaminophen 500 MG tablet   Commonly known as: TYLENOL      TAKE these medications         allopurinol 100 MG tablet   Commonly known as: ZYLOPRIM   Take 100 mg by mouth daily.      amoxicillin-clavulanate 875-125 MG per tablet   Commonly known as: AUGMENTIN   Take 1 tablet by mouth 2 (two) times daily.      benazepril 40 MG tablet   Commonly known as: LOTENSIN   Take 20 mg by mouth daily with breakfast. Takes 1/2 tablet of 40mg  twice daily      citalopram 20 MG tablet   Commonly known as: CELEXA   Take 20 mg by mouth daily.      influenza  inactive virus vaccine injection   Commonly known as: FLUZONE/FLUARIX   Inject 0.5 mLs into the muscle tomorrow at 10 am.      oxyCODONE 5 MG immediate release tablet   Commonly known as: Oxy IR/ROXICODONE   Take 1 tablet (5 mg total) by mouth every 4 (four) hours as needed.           Follow-up Information    Follow up with  [facility], MD. In 3 weeks. ( prior to your appointment.  thank you)    Contact information:   [address] [place] [address] [phone]          Signed: Emelia Loron 04/03/2012, 6:12 PM

## 2012-04-05 ENCOUNTER — Ambulatory Visit (INDEPENDENT_AMBULATORY_CARE_PROVIDER_SITE_OTHER): Payer: BC Managed Care – PPO | Admitting: General Surgery

## 2012-04-05 ENCOUNTER — Encounter (INDEPENDENT_AMBULATORY_CARE_PROVIDER_SITE_OTHER): Payer: Self-pay | Admitting: Surgery

## 2012-04-05 ENCOUNTER — Encounter (INDEPENDENT_AMBULATORY_CARE_PROVIDER_SITE_OTHER): Payer: Self-pay | Admitting: General Surgery

## 2012-04-05 ENCOUNTER — Ambulatory Visit (INDEPENDENT_AMBULATORY_CARE_PROVIDER_SITE_OTHER): Payer: BC Managed Care – PPO | Admitting: Surgery

## 2012-04-05 VITALS — BP 120/82 | HR 60 | Temp 97.2°F | Resp 16 | Ht 74.0 in | Wt 234.2 lb

## 2012-04-05 DIAGNOSIS — Z09 Encounter for follow-up examination after completed treatment for conditions other than malignant neoplasm: Secondary | ICD-10-CM

## 2012-04-05 DIAGNOSIS — E210 Primary hyperparathyroidism: Secondary | ICD-10-CM

## 2012-04-05 NOTE — Progress Notes (Signed)
General Surgery [facility]  Visit Diagnoses: No diagnosis found.  HISTORY: Patient is a 57 year old white male who underwent minimally invasive parathyroidectomy in October 2013 for primary hyperparathyroidism. Since his last office visit he has required urgent admission and laparoscopic cholecystectomy by my partner. He is also evaluated today by Dr. Dwain Sarna during this office visit.  Follow-up serum calcium level performed 03/24/2012 is normal at 9.4. A follow-up intact PTH level will be obtained this week.  EXAM: Cervical incision is well healed. No sign of seroma. Voice quality is normal.  IMPRESSION: Status post minimally invasive parathyroidectomy  PLAN: Serum calcium level is normal. We will check a PTH level this week. Patient will continue to apply topical creams to his incisions. He will return to see me as needed.  Velora Heckler, MD, FACS General & Endocrine Surgery [facility]

## 2012-04-05 NOTE — Patient Instructions (Signed)
  COCOA BUTTER & VITAMIN E CREAM  ([employer] or other brand)  Apply cocoa butter/vitamin E cream to your incision 2 - 3 times daily.  Massage cream into incision for one minute with each application.  Use sunscreen (50 SPF or higher) for first 6 months after surgery if area is exposed to sun.  You may substitute Mederma or other scar reducing creams as desired.   

## 2012-04-05 NOTE — Progress Notes (Signed)
Subjective:     Patient ID: Wayne Dixon, male   DOB: 1955-10-11, 57 y.o.   MRN: [id number]  HPI 89 yom s/p lap chole for cholecystitis on Xmas Day  He had returned today in f/u with Dr. Gerrit Friends for parathyroid and asked if I would see him also.  He has no complaints about eating, having normal bms and incisions doing fine.  He does however since hospital stay have left knee pain that is slowly getting better. This is superior to knee and over patella.  He had walked initially postop but then while in hospital developed the pain.    Review of Systems     Objective:   Physical Exam Abdominal incisions healing well without infection Left knee superior to patella with mild tenderness and edema, unable to fully flex at knee    Assessment:     Knee pain S/p lap chole    Plan:     He is doing fine from lap chole and I would release to full activity however knee really is bothering him.  I offered to send him to see sports med or ortho but he would like to give it a couple more weeks as it is slowly improving.  I asked him to call me in a couple weeks if this is not better.

## 2012-04-09 ENCOUNTER — Emergency Department (HOSPITAL_COMMUNITY): Payer: BC Managed Care – PPO

## 2012-04-09 ENCOUNTER — Emergency Department (HOSPITAL_COMMUNITY)
Admission: EM | Admit: 2012-04-09 | Discharge: 2012-04-09 | Disposition: A | Discharge status: Home / Self Care | Payer: BC Managed Care – PPO | Attending: Emergency Medicine | Admitting: Emergency Medicine

## 2012-04-09 ENCOUNTER — Encounter (HOSPITAL_COMMUNITY): Payer: Self-pay | Admitting: Emergency Medicine

## 2012-04-09 ENCOUNTER — Telehealth (INDEPENDENT_AMBULATORY_CARE_PROVIDER_SITE_OTHER): Payer: Self-pay | Admitting: General Surgery

## 2012-04-09 ENCOUNTER — Other Ambulatory Visit: Payer: Self-pay

## 2012-04-09 DIAGNOSIS — E079 Disorder of thyroid, unspecified: Secondary | ICD-10-CM | POA: Insufficient documentation

## 2012-04-09 DIAGNOSIS — F329 Major depressive disorder, single episode, unspecified: Secondary | ICD-10-CM | POA: Insufficient documentation

## 2012-04-09 DIAGNOSIS — Z87442 Personal history of urinary calculi: Secondary | ICD-10-CM | POA: Insufficient documentation

## 2012-04-09 DIAGNOSIS — I10 Essential (primary) hypertension: Secondary | ICD-10-CM | POA: Insufficient documentation

## 2012-04-09 DIAGNOSIS — Z862 Personal history of diseases of the blood and blood-forming organs and certain disorders involving the immune mechanism: Secondary | ICD-10-CM | POA: Insufficient documentation

## 2012-04-09 DIAGNOSIS — R7989 Other specified abnormal findings of blood chemistry: Secondary | ICD-10-CM | POA: Insufficient documentation

## 2012-04-09 DIAGNOSIS — G4730 Sleep apnea, unspecified: Secondary | ICD-10-CM | POA: Insufficient documentation

## 2012-04-09 DIAGNOSIS — R109 Unspecified abdominal pain: Secondary | ICD-10-CM

## 2012-04-09 DIAGNOSIS — Z87891 Personal history of nicotine dependence: Secondary | ICD-10-CM | POA: Insufficient documentation

## 2012-04-09 DIAGNOSIS — Z79899 Other long term (current) drug therapy: Secondary | ICD-10-CM | POA: Insufficient documentation

## 2012-04-09 DIAGNOSIS — F3289 Other specified depressive episodes: Secondary | ICD-10-CM | POA: Insufficient documentation

## 2012-04-09 DIAGNOSIS — Z8639 Personal history of other endocrine, nutritional and metabolic disease: Secondary | ICD-10-CM | POA: Insufficient documentation

## 2012-04-09 DIAGNOSIS — Z8719 Personal history of other diseases of the digestive system: Secondary | ICD-10-CM | POA: Insufficient documentation

## 2012-04-09 DIAGNOSIS — R1013 Epigastric pain: Secondary | ICD-10-CM | POA: Insufficient documentation

## 2012-04-09 DIAGNOSIS — Z9089 Acquired absence of other organs: Secondary | ICD-10-CM | POA: Insufficient documentation

## 2012-04-09 DIAGNOSIS — Z8679 Personal history of other diseases of the circulatory system: Secondary | ICD-10-CM | POA: Insufficient documentation

## 2012-04-09 DIAGNOSIS — R112 Nausea with vomiting, unspecified: Secondary | ICD-10-CM | POA: Insufficient documentation

## 2012-04-09 DIAGNOSIS — F411 Generalized anxiety disorder: Secondary | ICD-10-CM | POA: Insufficient documentation

## 2012-04-09 LAB — COMPREHENSIVE METABOLIC PANEL
ALT: 206 U/L — ABNORMAL HIGH (ref 0–53)
Alkaline Phosphatase: 149 U/L — ABNORMAL HIGH (ref 39–117)
GFR calc Af Amer: 81 mL/min — ABNORMAL LOW (ref >90)
Glucose, Bld: 133 mg/dL — ABNORMAL HIGH (ref 70–99)
Potassium: 4.3 mEq/L (ref 3.5–5.1)
Sodium: 139 mEq/L (ref 135–145)
Total Protein: 7.9 g/dL (ref 6.0–8.3)

## 2012-04-09 LAB — CBC WITH DIFFERENTIAL/PLATELET
Eosinophils Absolute: 0 10*3/uL (ref 0.0–0.7)
Lymphocytes Relative: 18 % (ref 12–46)
Lymphs Abs: 1.1 10*3/uL (ref 0.7–4.0)
MCH: 30.3 pg (ref 26.0–34.0)
Neutro Abs: 4.5 10*3/uL (ref 1.7–7.7)
Neutrophils Relative %: 75 % (ref 43–77)
Platelets: 220 10*3/uL (ref 150–400)
RBC: 4.46 MIL/uL (ref 4.22–5.81)
WBC: 5.9 10*3/uL (ref 4.0–10.5)

## 2012-04-09 LAB — URINALYSIS, ROUTINE W REFLEX MICROSCOPIC
Hgb urine dipstick: NEGATIVE
Specific Gravity, Urine: 1.023 (ref 1.005–1.030)
Urobilinogen, UA: 1 mg/dL (ref 0.0–1.0)
pH: 8 (ref 5.0–8.0)

## 2012-04-09 MED ORDER — IOHEXOL 300 MG/ML  SOLN
20.0000 mL | INTRAMUSCULAR | Status: AC
  Administered 2012-04-09 (×2): 25 mL via ORAL

## 2012-04-09 MED ORDER — ONDANSETRON HCL 4 MG/2ML IJ SOLN
4.0000 mg | Freq: Once | INTRAMUSCULAR | Status: AC
  Administered 2012-04-09: 4 mg via INTRAVENOUS
  Filled 2012-04-09 (×2): qty 2

## 2012-04-09 MED ORDER — IOHEXOL 300 MG/ML  SOLN
100.0000 mL | Freq: Once | INTRAMUSCULAR | Status: AC | PRN
  Administered 2012-04-09: 100 mL via INTRAVENOUS

## 2012-04-09 MED ORDER — HYDROMORPHONE HCL PF 1 MG/ML IJ SOLN: 1.0000 mg | Freq: Once | INTRAMUSCULAR | Status: DC

## 2012-04-09 MED ORDER — PANTOPRAZOLE SODIUM 40 MG IV SOLR
40.0000 mg | Freq: Once | INTRAVENOUS | Status: AC
  Administered 2012-04-09: 40 mg via INTRAVENOUS
  Filled 2012-04-09: qty 40

## 2012-04-09 NOTE — ED Notes (Signed)
CT made aware pt finished drinking contrast. Pt resting comfortable in bed. Made aware of plan of care. Denies pain. Denies complaint. NAD.

## 2012-04-09 NOTE — ED Notes (Signed)
Advised of the wait time 

## 2012-04-09 NOTE — ED Notes (Signed)
MD at bedside. 

## 2012-04-09 NOTE — ED Notes (Signed)
The pt advised of the wait time.  Lab back

## 2012-04-09 NOTE — Telephone Encounter (Signed)
Wife called to ask what is best for husband.  Had lap chole with IOC on 03/24/12.  Has done very well until today, but is leaving work with chills and stomach pains (above area of surgery.)  Advised to go to PCP to be evaluated for viral GI illness.  She agrees and will comply.

## 2012-04-09 NOTE — ED Notes (Signed)
Had gb out 12/25 today started to have epigastric pain after eating breakfast  Vomited and sweated pain went away and now it is coming back . Took a pepcid and mom did not help it is a crampy  Pain

## 2012-04-09 NOTE — ED Notes (Signed)
MD Steinl at bedside. 

## 2012-04-09 NOTE — ED Notes (Signed)
Incision sites assessed. Clean, intact. No redness/swelling/drainage.

## 2012-04-09 NOTE — ED Provider Notes (Signed)
History     CSN: [id number]  Arrival date & time 04/09/12  1415   First MD Initiated Contact with Patient 04/09/12 1756      Chief complaint:  Epigastric pain   (Consider location/radiation/quality/duration/timing/severity/associated sxs/prior treatment) The history is provided by the patient.  pt c/o acute onset epigastric pain, nv after eating breakfast this morning. Hx recent uncomplicated laparoscopic cholecystectomy 12/25, unremarkable post operative course, has been eating and drinking normally without nv, having normal bms, no fevers. States todays pain unlike prior gb pain which he felt more in ruq. Current pain also felt in back. No hx pud, pancreatitis. No chest pain or discomfort. No sob. No fever or chills, did feel sweaty earlier. No gu c/o. No mid to lower abd pain. No distension. Normal bm today. 2 episodes emesis, clear, not bloody or bilious. No other abd surgery.     Past Medical History  Diagnosis Date  . History of gout   . Hypercalcemia   . Depression   . Hypertension   . Anxiety   . Allergy   . IBS (irritable bowel syndrome)   . Thyroid disease   . Headache     due to cervical disc problem  . History of kidney stones 2002  . Elevated bilirubin   . Sleep apnea     mild doses not use c-pap    Past Surgical History  Procedure Date  . Mole removal 2000  . Colonoscopy 2010  . Parathyroidectomy 12/25/2011    Procedure: PARATHYROIDECTOMY;  Surgeon: Velora Heckler, MD;  Location: [facility] ORS;  Service: General;  Laterality: N/A;  . Cholecystectomy 03/24/2012    Procedure: LAPAROSCOPIC CHOLECYSTECTOMY WITH INTRAOPERATIVE CHOLANGIOGRAM;  Surgeon: Emelia Loron, MD;  Location: [facility] OR;  Service: General;  Laterality: N/A;  laparascopic cholecystectomy    No family history on file.  History  Substance Use Topics  . Smoking status: Former Games developer  . Smokeless tobacco: Former Neurosurgeon    Quit date: 10/17/1986  . Alcohol Use: Yes     Comment: occasional       Review of Systems  Constitutional: Negative for fever and chills.  HENT: Negative for neck pain.   Eyes: Negative for redness.  Respiratory: Negative for cough and shortness of breath.   Cardiovascular: Negative for chest pain and leg swelling.  Gastrointestinal: Positive for nausea, vomiting and abdominal pain.  Genitourinary: Negative for flank pain.  Musculoskeletal: Negative for back pain.  Skin: Negative for rash.  Neurological: Negative for headaches.  Hematological: Does not bruise/bleed easily.  Psychiatric/Behavioral: Negative for confusion.    Allergies  Review of patient's allergies indicates no known allergies.  Home Medications   Current Outpatient Rx  Name  Route  Sig  Dispense  Refill  . ALLOPURINOL 100 MG PO TABS   Oral   Take 100 mg by mouth daily.         . AMOXICILLIN-POT CLAVULANATE 875-125 MG PO TABS   Oral   Take 1 tablet by mouth 2 (two) times daily.   10 tablet   0   . BENAZEPRIL HCL 40 MG PO TABS   Oral   Take 20 mg by mouth daily with breakfast. Takes 1/2 tablet of 40mg  twice daily         . CITALOPRAM HYDROBROMIDE 20 MG PO TABS   Oral   Take 10 mg by mouth daily.          . INFLUENZA VIRUS VACC SPLIT PF IM SUSP   Intramuscular  Inject 0.5 mLs into the muscle tomorrow at 10 am.   0.25 mL   0   . OXYCODONE HCL 5 MG PO TABS   Oral   Take 1 tablet (5 mg total) by mouth every 4 (four) hours as needed.   30 tablet   0     BP 146/82  Pulse 80  Temp 97.6 F (36.4 C)  Resp 16  SpO2 97%  Physical Exam  Nursing note and vitals reviewed. Constitutional: He is oriented to person, place, and time. He appears well-developed and well-nourished. No distress.  HENT:  Head: Atraumatic.  Mouth/Throat: Oropharynx is clear and moist.  Eyes: Conjunctivae normal are normal. No scleral icterus.  Neck: Neck supple. No tracheal deviation present.  Cardiovascular: Normal rate, regular rhythm, normal heart sounds and intact distal  pulses.   Pulmonary/Chest: Effort normal and breath sounds normal. No accessory muscle usage. No respiratory distress.  Abdominal: Soft. Bowel sounds are normal. He exhibits no distension and no mass. There is tenderness. There is no rebound and no guarding.       Mild epigastric tenderness. Several well healed laparoscopy incisions without incarc hernia or sign of infection.   Genitourinary:       No cva tenderness  Musculoskeletal: Normal range of motion. He exhibits no edema and no tenderness.  Neurological: He is alert and oriented to person, place, and time.  Skin: Skin is warm and dry.  Psychiatric: He has a normal mood and affect.    ED Course  Procedures (including critical care time)  Labs Reviewed  COMPREHENSIVE METABOLIC PANEL - Abnormal; Notable for the following:    Glucose, Bld 133 (*)     AST 348 (*)     ALT 206 (*)     Alkaline Phosphatase 149 (*)     Total Bilirubin 2.3 (*)     GFR calc non Af Amer 70 (*)     GFR calc Af Amer 81 (*)     All other components within normal limits  CBC WITH DIFFERENTIAL  LIPASE, BLOOD  URINALYSIS, ROUTINE W REFLEX MICROSCOPIC  TROPONIN I   Results for orders placed during the hospital encounter of 04/09/12  CBC WITH DIFFERENTIAL      Component Value Range   WBC 5.9  4.0 - 10.5 K/uL   RBC 4.46  4.22 - 5.81 MIL/uL   Hemoglobin 13.5  13.0 - 17.0 g/dL   HCT 16.1  09.6 - 04.5 %   MCV 88.3  78.0 - 100.0 fL   MCH 30.3  26.0 - 34.0 pg   MCHC 34.3  30.0 - 36.0 g/dL   RDW 40.9  81.1 - 91.4 %   Platelets 220  150 - 400 K/uL   Neutrophils Relative 75  43 - 77 %   Neutro Abs 4.5  1.7 - 7.7 K/uL   Lymphocytes Relative 18  12 - 46 %   Lymphs Abs 1.1  0.7 - 4.0 K/uL   Monocytes Relative 6  3 - 12 %   Monocytes Absolute 0.3  0.1 - 1.0 K/uL   Eosinophils Relative 1  0 - 5 %   Eosinophils Absolute 0.0  0.0 - 0.7 K/uL   Basophils Relative 0  0 - 1 %   Basophils Absolute 0.0  0.0 - 0.1 K/uL  COMPREHENSIVE METABOLIC PANEL      Component  Value Range   Sodium 139  135 - 145 mEq/L   Potassium 4.3  3.5 - 5.1 mEq/L  Chloride 100  96 - 112 mEq/L   CO2 29  19 - 32 mEq/L   Glucose, Bld 133 (*) 70 - 99 mg/dL   BUN 14  6 - 23 mg/dL   Creatinine, Ser 1.61  0.50 - 1.35 mg/dL   Calcium 09.6  8.4 - 04.5 mg/dL   Total Protein 7.9  6.0 - 8.3 g/dL   Albumin 4.3  3.5 - 5.2 g/dL   AST 409 (*) 0 - 37 U/L   ALT 206 (*) 0 - 53 U/L   Alkaline Phosphatase 149 (*) 39 - 117 U/L   Total Bilirubin 2.3 (*) 0.3 - 1.2 mg/dL   GFR calc non Af Amer 70 (*) >90 mL/min   GFR calc Af Amer 81 (*) >90 mL/min  LIPASE, BLOOD      Component Value Range   Lipase 28  11 - 59 U/L  URINALYSIS, ROUTINE W REFLEX MICROSCOPIC      Component Value Range   Color, Urine AMBER (*) YELLOW   APPearance HAZY (*) CLEAR   Specific Gravity, Urine 1.023  1.005 - 1.030   pH 8.0  5.0 - 8.0   Glucose, UA NEGATIVE  NEGATIVE mg/dL   Hgb urine dipstick NEGATIVE  NEGATIVE   Bilirubin Urine SMALL (*) NEGATIVE   Ketones, ur NEGATIVE  NEGATIVE mg/dL   Protein, ur NEGATIVE  NEGATIVE mg/dL   Urobilinogen, UA 1.0  0.0 - 1.0 mg/dL   Nitrite NEGATIVE  NEGATIVE   Leukocytes, UA NEGATIVE  NEGATIVE  TROPONIN I      Component Value Range   Troponin I <0.30  <0.30 ng/mL   US Abdomen Complete  04/09/2012  *RADIOLOGY REPORT*  Clinical Data:  Recent microscopic cholecystectomy on 03/24/2012. Epigastric pain.  COMPLETE ABDOMINAL ULTRASOUND  Comparison:  Ultrasound 03/23/2012  Findings:  Gallbladder:  Gallbladder surgically absent.  There is echogenic material in the gallbladder fossa with posterior shadowing.  This is could represent gas within the gallbladder fossa.  Small amount fluid within the fossa.  Common bile duct:  Normal diameter at 3.7 mm.  Liver:  Diffuse increase in parenchymal echogenicity.  This finding is similar to pre procedural scan.  IVC:  Appears normal.  Pancreas:  No focal abnormality seen.  Spleen:  Normal size and echogenicity.  Right Kidney:  Normal  Left Kidney:   Normal  Abdominal aorta:  No aneurysm identified.  IMPRESSION:  1.  Heterogeneous material in the gallbladder fossa status post cholecystectomy.  Cannot exclude gas within the gallbladder fossa versus normal postsurgical inflammation.  Consider CT of the abdomen pelvis with contrast if concern for surgical complication. 2.  Heterogeneous liver commonly represents hepatic steatosis.   Original Report Authenticated By: Genevive Bi, M.D.    US Abdomen Complete  03/23/2012  *RADIOLOGY REPORT*  Clinical Data:  Right upper quadrant pain.  COMPLETE ABDOMINAL ULTRASOUND  Comparison:  01/26/2009.  Findings:  Gallbladder:  Small gallstones are present, measuring about 5 mm. Gallbladder wall measures 4 mm.  Sonographic Murphy's sign is present.  Common bile duct:  4 mm, normal.  Liver:  Echogenic compatible with hepatic steatosis.  IVC:  Appears normal.  Pancreas:  Poorly visualized due to overlying bowel gas.  Spleen:  Splenomegaly is present with 16.3 cm splenic span. Splenic volume is 853 ml.  Right Kidney:  10.6 cm. Normal echotexture.  Normal central sinus echo complex.  No calculi or hydronephrosis.  Left Kidney:  10.6 cm. Normal echotexture.  Normal central sinus echo complex.  No calculi  or hydronephrosis.  Abdominal aorta:  2.5 cm.  IMPRESSION: Cholelithiasis with positive sonographic Murphy's sign highly suggestive of early acute cholecystitis.  Fatty liver and hepatosplenomegaly.   Original Report Authenticated By: Andreas Newport, M.D.    Ct Abdomen Pelvis W Contrast  04/09/2012  *RADIOLOGY REPORT*  Clinical Data: Prior cholecystectomy.  Upper abdominal pain. Vomiting.  CT ABDOMEN AND PELVIS WITH CONTRAST  Technique:  Multidetector CT imaging of the abdomen and pelvis was performed following the standard protocol during bolus administration of intravenous contrast.  Contrast: OMNIPAQUE IOHEXOL 300 MG/ML  SOLN  Comparison: Ultrasound 04/09/2012  Findings: Linear atelectasis in both lung bases.  No  effusions. Heart is normal size.  Prior cholecystectomy.  There is a small ill-defined fluid collection within the gallbladder fossa.  This measures 4.1 x 2.5 cm.  No free fluid in the abdomen or pelvis.  Liver, spleen, pancreas, adrenals and kidneys are unremarkable. Stomach, large and small bowel have a normal appearance.  Slight ectasia of the distal abdominal aorta measuring 2.7 cm.  Moderate atherosclerotic calcifications in the aorta and iliac vessels.  No acute bony abnormality.  IMPRESSION: 4 cm ill-defined fluid collection within the gallbladder fossa. Differential considerations would include postoperative hematoma, biloma or abscess.   Original Report Authenticated By: Charlett Nose, M.D.       MDM  Iv ns. Dilaudid 1 mg iv. zofran iv. protonix iv. Labs.  Elevated lfts noted. Will get u/s. Lipase normal.  Reviewed nursing notes and prior charts for additional history.    Date: 04/09/2012  Rate: 77  Rhythm: normal sinus rhythm  QRS Axis: normal  Intervals: normal  ST/T Wave abnormalities: normal  Conduction Disutrbances:none  Narrative Interpretation:   Old EKG Reviewed: unchanged  Discussed labs ct and u/s w pt.   Recheck x 2, symptoms completely resolved. No pain. No nv. abd soft nt. No fevers.   Discussed w gen surgery on call, Dr Lindie Spruce, including new elevation lfts and ct and u/s findings - he indicates dc home, requests we refer to gi for f/u in coming week.   Pt on allopurinol x 1 month, no current gout symptoms, will have hold, esp in light of elevated lfts. No other new meds use, no statin use.    Discussed diff dx w pt, incl bilary stone.  Pt stable for d/c. Remains asymptomatic.           Suzi Roots, MD 04/09/12 2239

## 2012-04-09 NOTE — ED Notes (Signed)
Md at bedside

## 2012-04-14 ENCOUNTER — Encounter: Payer: Self-pay | Admitting: Gastroenterology

## 2012-04-16 ENCOUNTER — Telehealth (INDEPENDENT_AMBULATORY_CARE_PROVIDER_SITE_OTHER): Payer: Self-pay

## 2012-04-16 ENCOUNTER — Encounter (INDEPENDENT_AMBULATORY_CARE_PROVIDER_SITE_OTHER): Payer: Self-pay

## 2012-04-16 NOTE — Telephone Encounter (Signed)
PTH normal/to Dr Gerrit Friends to review.

## 2012-05-04 ENCOUNTER — Ambulatory Visit: Payer: Self-pay | Admitting: Gastroenterology

## 2012-07-28 ENCOUNTER — Telehealth (INDEPENDENT_AMBULATORY_CARE_PROVIDER_SITE_OTHER): Payer: Self-pay

## 2012-07-28 NOTE — Telephone Encounter (Signed)
Refill request for hydrocodone declined. Pt not seen or treated by our office since January 2014. Advised to refer to PCP.

## 2013-06-02 ENCOUNTER — Other Ambulatory Visit: Payer: Self-pay | Admitting: Family

## 2013-06-02 ENCOUNTER — Ambulatory Visit
Admission: RE | Admit: 2013-06-02 | Discharge: 2013-06-02 | Disposition: A | Discharge status: Home / Self Care | Payer: BC Managed Care – PPO | Source: Ambulatory Visit | Attending: Family | Admitting: Family

## 2013-06-02 DIAGNOSIS — M79641 Pain in right hand: Secondary | ICD-10-CM

## 2013-06-02 DIAGNOSIS — R05 Cough: Secondary | ICD-10-CM

## 2013-06-02 DIAGNOSIS — R059 Cough, unspecified: Secondary | ICD-10-CM

## 2013-06-02 DIAGNOSIS — R509 Fever, unspecified: Secondary | ICD-10-CM

## 2013-11-14 ENCOUNTER — Encounter: Payer: Self-pay | Admitting: *Deleted

## 2014-07-31 ENCOUNTER — Emergency Department (HOSPITAL_COMMUNITY): Payer: BLUE CROSS/BLUE SHIELD

## 2014-07-31 ENCOUNTER — Emergency Department (HOSPITAL_COMMUNITY)
Admission: EM | Admit: 2014-07-31 | Discharge: 2014-07-31 | Disposition: A | Discharge status: Home / Self Care | Payer: BLUE CROSS/BLUE SHIELD | Attending: Emergency Medicine | Admitting: Emergency Medicine

## 2014-07-31 ENCOUNTER — Encounter (HOSPITAL_COMMUNITY): Payer: Self-pay | Admitting: *Deleted

## 2014-07-31 DIAGNOSIS — Z8669 Personal history of other diseases of the nervous system and sense organs: Secondary | ICD-10-CM | POA: Diagnosis not present

## 2014-07-31 DIAGNOSIS — I10 Essential (primary) hypertension: Secondary | ICD-10-CM | POA: Insufficient documentation

## 2014-07-31 DIAGNOSIS — Z79899 Other long term (current) drug therapy: Secondary | ICD-10-CM | POA: Insufficient documentation

## 2014-07-31 DIAGNOSIS — Z720 Tobacco use: Secondary | ICD-10-CM | POA: Insufficient documentation

## 2014-07-31 DIAGNOSIS — F329 Major depressive disorder, single episode, unspecified: Secondary | ICD-10-CM | POA: Diagnosis not present

## 2014-07-31 DIAGNOSIS — R61 Generalized hyperhidrosis: Secondary | ICD-10-CM | POA: Insufficient documentation

## 2014-07-31 DIAGNOSIS — Z8639 Personal history of other endocrine, nutritional and metabolic disease: Secondary | ICD-10-CM | POA: Insufficient documentation

## 2014-07-31 DIAGNOSIS — Z87442 Personal history of urinary calculi: Secondary | ICD-10-CM | POA: Insufficient documentation

## 2014-07-31 DIAGNOSIS — R0789 Other chest pain: Secondary | ICD-10-CM | POA: Insufficient documentation

## 2014-07-31 DIAGNOSIS — M109 Gout, unspecified: Secondary | ICD-10-CM | POA: Insufficient documentation

## 2014-07-31 DIAGNOSIS — R079 Chest pain, unspecified: Secondary | ICD-10-CM | POA: Diagnosis present

## 2014-07-31 DIAGNOSIS — F419 Anxiety disorder, unspecified: Secondary | ICD-10-CM | POA: Diagnosis not present

## 2014-07-31 LAB — CBC
HCT: 42 % (ref 39.0–52.0)
HEMOGLOBIN: 14.6 g/dL (ref 13.0–17.0)
MCH: 31 pg (ref 26.0–34.0)
MCHC: 34.8 g/dL (ref 30.0–36.0)
MCV: 89.2 fL (ref 78.0–100.0)
Platelets: 183 10*3/uL (ref 150–400)
RBC: 4.71 MIL/uL (ref 4.22–5.81)
RDW: 12.9 % (ref 11.5–15.5)
WBC: 6 10*3/uL (ref 4.0–10.5)

## 2014-07-31 LAB — I-STAT TROPONIN, ED
Troponin i, poc: 0 ng/mL (ref 0.00–0.08)
Troponin i, poc: 0 ng/mL (ref 0.00–0.08)

## 2014-07-31 LAB — BASIC METABOLIC PANEL
ANION GAP: 12 (ref 5–15)
BUN: 14 mg/dL (ref 6–20)
CO2: 24 mmol/L (ref 22–32)
CREATININE: 1.17 mg/dL (ref 0.61–1.24)
Calcium: 9.5 mg/dL (ref 8.9–10.3)
Chloride: 103 mmol/L (ref 101–111)
GFR calc Af Amer: >60 mL/min (ref >60)
GFR calc non Af Amer: >60 mL/min (ref >60)
GLUCOSE: 133 mg/dL — AB (ref 70–99)
Potassium: 4.3 mmol/L (ref 3.5–5.1)
SODIUM: 139 mmol/L (ref 135–145)

## 2014-07-31 MED ORDER — ASPIRIN 81 MG PO CHEW
324.0000 mg | CHEWABLE_TABLET | Freq: Once | ORAL | Status: AC
  Administered 2014-07-31: 324 mg via ORAL
  Filled 2014-07-31: qty 4

## 2014-07-31 NOTE — ED Notes (Addendum)
PT states acute onset L sided chest pain he describes as cramping lasting for several minutes and starting at 0630.  Denies chest pain presently, but states feels restless.  Also states he woke up sweating.  Pt wife just left him and has driven him into bankruptcy, he's about to get fired and his daughter is getting ready to move.

## 2014-07-31 NOTE — ED Provider Notes (Signed)
CSN: [id number]     Arrival date & time 07/31/14  0707 History   First MD Initiated Contact with Patient 07/31/14 [phone]     Chief Complaint  Patient presents with  . Chest Pain     (Consider location/radiation/quality/duration/timing/severity/associated sxs/prior Treatment) HPI 59 year old male presents after having about 2 minutes of left-sided cramping chest pain around 6:30. He states he was driving to work and noticed that all of a sudden. The patient denied shortness of breath, nausea, or radiation of the pain. The pain has spontaneously resolved. However he states he does feel a mild twinge in his chest that is reproducible to him. He denies leg pain or leg swelling. He has a history of hypertension and borderline hyperlipidemia but has been improving his diet and exercise and has lost 14 pounds recently. He does admit to a lot of stressors in his life. He is currently moving out of his house because of her recent separation from his wife as well as his reading emails in the last 2 days that were indicating he's going to be fired soon. He is also nearly bankrupt. He does state he woke up this morning (monday) sweaty but without pain. That has resolved. The patient denies any SI/HI.  Past Medical History  Diagnosis Date  . History of gout   . Hypercalcemia   . Depression   . Hypertension   . Anxiety   . Allergy   . IBS (irritable bowel syndrome)   . Headache(784.0)     due to cervical disc problem  . History of kidney stones 2002  . Elevated bilirubin   . Sleep apnea     mild doses not use c-pap  . Thyroid disease     hyperthyroid   Past Surgical History  Procedure Laterality Date  . Mole removal  2000  . Colonoscopy  2010  . Parathyroidectomy  12/25/2011    Procedure: PARATHYROIDECTOMY;  Surgeon: Earnstine Regal, MD;  Location: [facility];  Service: General;  Laterality: N/A;  . Cholecystectomy  03/24/2012    Procedure: LAPAROSCOPIC CHOLECYSTECTOMY WITH INTRAOPERATIVE  CHOLANGIOGRAM;  Surgeon: Rolm Bookbinder, MD;  Location: [facility];  Service: General;  Laterality: N/A;  laparascopic cholecystectomy   No family history on file. History  Substance Use Topics  . Smoking status: Current Every Day Smoker -- 0.15 packs/day    Types: Cigarettes  . Smokeless tobacco: Former Systems developer    Quit date: 10/17/1986  . Alcohol Use: Yes     Comment: occasional    Review of Systems  Constitutional: Positive for diaphoresis. Negative for fever.  Respiratory: Negative for cough and shortness of breath.   Cardiovascular: Positive for chest pain. Negative for leg swelling.  Gastrointestinal: Negative for vomiting.  Neurological: Negative for weakness and light-headedness.  All other systems reviewed and are negative.     Allergies  Atorvastatin  Home Medications   Prior to Admission medications   Medication Sig Start Date End Date Taking? Authorizing Provider  allopurinol (ZYLOPRIM) 100 MG tablet Take 100 mg by mouth daily.    Historical Provider, MD  benazepril (LOTENSIN) 40 MG tablet Take 20 mg by mouth daily with breakfast.     Historical Provider, MD  citalopram (CELEXA) 20 MG tablet Take 10 mg by mouth daily.     Historical Provider, MD  colchicine 0.6 MG tablet Take 0.6 mg by mouth 2 (two) times daily.    Historical Provider, MD  Famotidine (PEPCID PO) Take 1 tablet by mouth once. OTC Pepcid  Historical Provider, MD  loratadine (CLARITIN) 10 MG tablet Take 10 mg by mouth daily as needed for allergies.    Historical Provider, MD  Magnesium Hydroxide (MILK OF MAGNESIA PO) Take 20 mLs by mouth once.    Historical Provider, MD  montelukast (SINGULAIR) 10 MG tablet Take 10 mg by mouth at bedtime.    Historical Provider, MD   BP 146/93 mmHg  Pulse 88  Temp(Src) 98.2 F (36.8 C) (Oral)  Resp 16  Ht 6\' 2"  (1.88 m)  Wt 225 lb (102.059 kg)  BMI 28.88 kg/m2  SpO2 97% Physical Exam  Constitutional: He is oriented to person, place, and time. He appears  well-developed and well-nourished.  HENT:  Head: Normocephalic and atraumatic.  Right Ear: External ear normal.  Left Ear: External ear normal.  Nose: Nose normal.  Eyes: Right eye exhibits no discharge. Left eye exhibits no discharge.  Neck: Neck supple.  Cardiovascular: Normal rate, regular rhythm, normal heart sounds and intact distal pulses.   Pulmonary/Chest: Effort normal and breath sounds normal. He exhibits tenderness.    Abdominal: Soft. There is no tenderness.  Musculoskeletal: He exhibits no edema or tenderness.  Neurological: He is alert and oriented to person, place, and time.  Skin: Skin is warm and dry.  Psychiatric: He exhibits a depressed mood. He expresses no suicidal ideation.  Nursing note and vitals reviewed.   ED Course  Procedures (including critical care time) Labs Review Labs Reviewed  BASIC METABOLIC PANEL - Abnormal; Notable for the following:    Glucose, Bld 133 (*)    All other components within normal limits  CBC  I-STAT TROPOININ, ED  Randolm Idol, ED    Imaging Review Dg Chest 2 View  07/31/2014   CLINICAL DATA:  Left side chest pain  EXAM: CHEST  2 VIEW  COMPARISON:  06/02/2013  FINDINGS: Cardiomediastinal silhouette is stable. No acute infiltrate or pleural effusion. No pulmonary edema. Stable mild degenerative changes thoracic spine.  IMPRESSION: No active cardiopulmonary disease.   Electronically Signed   By: Lahoma Crocker M.D.   On: 07/31/2014 09:21     EKG Interpretation   Date/Time:  Monday Jul 31 2014 07:15:01 EDT Ventricular Rate:  76 PR Interval:  186 QRS Duration: 104 QT Interval:  354 QTC Calculation: 398 R Axis:   60 Text Interpretation:  Sinus rhythm Normal ECG No significant change since  last tracing Confirmed by Christy Gentles  MD, Elenore Rota ([id number]) on 07/31/2014 7:40:35  AM      MDM   Final diagnoses:  Chest pain, unspecified chest pain type    Patient with atypical chest pain seems more related to stress than acute  coronary syndrome. EKG is normal. 2 troponins are also normal. This is not consistent with pulmonary embolism. The patient has significant stressors in his life and had the atypical chest pain while driving to work where he thinks is going to be fired. He has not had any pain since arrival to the ER. I believe patient is stable for discharge to follow up with his PCP and we have discussed strict return precautions. He is not suicidal but does seem depressed, will give outpatient resources for counseling.    Sherwood Gambler, MD 07/31/14 [phone]

## 2014-07-31 NOTE — Discharge Instructions (Signed)
Chest Pain (Nonspecific) It is often hard to give a specific diagnosis for the cause of chest pain. There is always a chance that your pain could be related to something serious, such as a heart attack or a blood clot in the lungs. You need to follow up with your health care provider for further evaluation. CAUSES   Heartburn.  Pneumonia or bronchitis.  Anxiety or stress.  Inflammation around your heart (pericarditis) or lung (pleuritis or pleurisy).  A blood clot in the lung.  A collapsed lung (pneumothorax). It can develop suddenly on its own (spontaneous pneumothorax) or from trauma to the chest.  Shingles infection (herpes zoster virus). The chest wall is composed of bones, muscles, and cartilage. Any of these can be the source of the pain.  The bones can be bruised by injury.  The muscles or cartilage can be strained by coughing or overwork.  The cartilage can be affected by inflammation and become sore (costochondritis). DIAGNOSIS  Lab tests or other studies may be needed to find the cause of your pain. Your health care provider may have you take a test called an ambulatory electrocardiogram (ECG). An ECG records your heartbeat patterns over a 24-hour period. You may also have other tests, such as:  Transthoracic echocardiogram (TTE). During echocardiography, sound waves are used to evaluate how blood flows through your heart.  Transesophageal echocardiogram (TEE).  Cardiac monitoring. This allows your health care provider to monitor your heart rate and rhythm in real time.  Holter monitor. This is a portable device that records your heartbeat and can help diagnose heart arrhythmias. It allows your health care provider to track your heart activity for several days, if needed.  Stress tests by exercise or by giving medicine that makes the heart beat faster. TREATMENT   Treatment depends on what may be causing your chest pain. Treatment may include:  Acid blockers for  heartburn.  Anti-inflammatory medicine.  Pain medicine for inflammatory conditions.  Antibiotics if an infection is present.  You may be advised to change lifestyle habits. This includes stopping smoking and avoiding alcohol, caffeine, and chocolate.  You may be advised to keep your head raised (elevated) when sleeping. This reduces the chance of acid going backward from your stomach into your esophagus. Most of the time, nonspecific chest pain will improve within 2-3 days with rest and mild pain medicine.  HOME CARE INSTRUCTIONS   If antibiotics were prescribed, take them as directed. Finish them even if you start to feel better.  For the next few days, avoid physical activities that bring on chest pain. Continue physical activities as directed.  Do not use any tobacco products, including cigarettes, chewing tobacco, or electronic cigarettes.  Avoid drinking alcohol.  Only take medicine as directed by your health care provider.  Follow your health care provider's suggestions for further testing if your chest pain does not go away.  Keep any follow-up appointments you made. If you do not go to an appointment, you could develop lasting (chronic) problems with pain. If there is any problem keeping an appointment, call to reschedule. SEEK MEDICAL CARE IF:   Your chest pain does not go away, even after treatment.  You have a rash with blisters on your chest.  You have a fever. SEEK IMMEDIATE MEDICAL CARE IF:   You have increased chest pain or pain that spreads to your arm, neck, jaw, back, or abdomen.  You have shortness of breath.  You have an increasing cough, or you cough  up blood. °· You have severe back or abdominal pain. °· You feel nauseous or vomit. °· You have severe weakness. °· You faint. °· You have chills. °This is an emergency. Do not wait to see if the pain will go away. Get medical help at once. Call your local emergency services (911 in [place] Do not drive  yourself to the hospital. °MAKE SURE YOU:  °· Understand these instructions. °· Will watch your condition. °· Will get help right away if you are not doing well or get worse. °Document Released: 12/25/2004 Document Revised: 03/22/2013 Document Reviewed: 10/21/2007 °[employer]® Patient Information ©2015 [employer]. This information is not intended to replace advice given to you by your health care provider. Make sure you discuss any questions you have with your health care provider. ° ° °Stress and Stress Management °Stress is a normal reaction to life events. It is what you feel when life demands more than you are used to or more than you can handle. Some stress can be useful. For example, the stress reaction can help you catch the last bus of the day, study for a test, or meet a deadline at work. But stress that occurs too often or for too long can cause problems. It can affect your emotional health and interfere with relationships and normal daily activities. Too much stress can weaken your immune system and increase your risk for physical illness. If you already have a medical problem, stress can make it worse. °CAUSES  °All sorts of life events may cause stress. An event that causes stress for one person may not be stressful for another person. Major life events commonly cause stress. These may be positive or negative. Examples include losing your job, moving into a new home, getting married, having a baby, or losing a loved one. Less obvious life events may also cause stress, especially if they occur day after day or in combination. Examples include working long hours, driving in traffic, caring for children, being in debt, or being in a difficult relationship. °SIGNS AND SYMPTOMS °Stress may cause emotional symptoms including, the following: °· Anxiety. This is feeling worried, afraid, on edge, overwhelmed, or out of control. °· Anger. This is feeling irritated or impatient. °· Depression. This is feeling  sad, down, helpless, or guilty. °· Difficulty focusing, remembering, or making decisions. °Stress may cause physical symptoms, including the following:  °· Aches and pains. These may affect your head, neck, back, stomach, or other areas of your body. °· Tight muscles or clenched jaw. °· Low energy or trouble sleeping.  °Stress may cause unhealthy behaviors, including the following:  °· Eating to feel better (overeating) or skipping meals. °· Sleeping too little, too much, or both. °· Working too much or putting off tasks (procrastination). °· Smoking, drinking alcohol, or using drugs to feel better. °DIAGNOSIS  °Stress is diagnosed through an assessment by your health care provider. Your health care provider will ask questions about your symptoms and any stressful life events. Your health care provider will also ask about your medical history and may order blood tests or other tests. Certain medical conditions and medicine can cause physical symptoms similar to stress.  Mental illness can cause emotional symptoms and unhealthy behaviors similar to stress. Your health care provider may refer you to a mental health professional for further evaluation.  °TREATMENT  °Stress management is the recommended treatment for stress. The goals of stress management are reducing stressful life events and coping with stress in healthy ways.  °Techniques for reducing   stressful life events include the following:  Stress identification. Self-monitor for stress and identify what causes stress for you. These skills may help you to avoid some stressful events.  Time management. Set your priorities, keep a calendar of events, and learn to say "no." These tools can help you avoid making too many commitments. Techniques for coping with stress include the following:  Rethinking the problem. Try to think realistically about stressful events rather than ignoring them or overreacting. Try to find the positives in a stressful situation  rather than focusing on the negatives.  Exercise. Physical exercise can release both physical and emotional tension. The key is to find a form of exercise you enjoy and do it regularly.  Relaxation techniques. These relax the body and mind. Examples include yoga, meditation, tai chi, biofeedback, deep breathing, progressive muscle relaxation, listening to music, being out in nature, journaling, and other hobbies. Again, the key is to find one or more that you enjoy and can do regularly.  Healthy lifestyle. Eat a balanced diet, get plenty of sleep, and do not smoke. Avoid using alcohol or drugs to relax.  Strong support network. Spend time with family, friends, or other people you enjoy being around.Express your feelings and talk things over with someone you trust. Counseling or talktherapy with a mental health professional may be helpful if you are having difficulty managing stress on your own. Medicine is typically not recommended for the treatment of stress.Talk to your health care provider if you think you need medicine for symptoms of stress. HOME CARE INSTRUCTIONS  Keep all follow-up visits as directed by your health care provider.  Take all medicines as directed by your health care provider. SEEK MEDICAL CARE IF:  Your symptoms get worse or you start having new symptoms.  You feel overwhelmed by your problems and can no longer manage them on your own. SEEK IMMEDIATE MEDICAL CARE IF:  You feel like hurting yourself or someone else. Document Released: 09/10/2000 Document Revised: 08/01/2013 Document Reviewed: 11/09/2012 [facility] Patient Information 2015 [place], [place]. This information is not intended to replace advice given to you by your health care provider. Make sure you discuss any questions you have with your health care provider.

## 2016-06-21 ENCOUNTER — Ambulatory Visit (HOSPITAL_COMMUNITY)
Admission: EM | Admit: 2016-06-21 | Discharge: 2016-06-21 | Disposition: A | Discharge status: Nursing Home (Includes ICF) | Payer: BLUE CROSS/BLUE SHIELD

## 2016-06-21 ENCOUNTER — Emergency Department (HOSPITAL_COMMUNITY): Payer: BLUE CROSS/BLUE SHIELD

## 2016-06-21 ENCOUNTER — Other Ambulatory Visit: Payer: Self-pay

## 2016-06-21 ENCOUNTER — Encounter (HOSPITAL_COMMUNITY): Payer: Self-pay | Admitting: Emergency Medicine

## 2016-06-21 ENCOUNTER — Observation Stay (HOSPITAL_COMMUNITY)
Admission: EM | Admit: 2016-06-21 | Discharge: 2016-06-24 | Disposition: A | Discharge status: Home / Self Care | Payer: BLUE CROSS/BLUE SHIELD | Attending: Internal Medicine | Admitting: Internal Medicine

## 2016-06-21 DIAGNOSIS — R9439 Abnormal result of other cardiovascular function study: Principal | ICD-10-CM | POA: Insufficient documentation

## 2016-06-21 DIAGNOSIS — G4730 Sleep apnea, unspecified: Secondary | ICD-10-CM | POA: Diagnosis not present

## 2016-06-21 DIAGNOSIS — I10 Essential (primary) hypertension: Secondary | ICD-10-CM | POA: Diagnosis present

## 2016-06-21 DIAGNOSIS — F329 Major depressive disorder, single episode, unspecified: Secondary | ICD-10-CM | POA: Diagnosis not present

## 2016-06-21 DIAGNOSIS — L4050 Arthropathic psoriasis, unspecified: Secondary | ICD-10-CM | POA: Diagnosis not present

## 2016-06-21 DIAGNOSIS — E785 Hyperlipidemia, unspecified: Secondary | ICD-10-CM | POA: Diagnosis present

## 2016-06-21 DIAGNOSIS — E782 Mixed hyperlipidemia: Secondary | ICD-10-CM | POA: Insufficient documentation

## 2016-06-21 DIAGNOSIS — F17210 Nicotine dependence, cigarettes, uncomplicated: Secondary | ICD-10-CM | POA: Insufficient documentation

## 2016-06-21 DIAGNOSIS — E0590 Thyrotoxicosis, unspecified without thyrotoxic crisis or storm: Secondary | ICD-10-CM | POA: Diagnosis not present

## 2016-06-21 DIAGNOSIS — K589 Irritable bowel syndrome without diarrhea: Secondary | ICD-10-CM | POA: Insufficient documentation

## 2016-06-21 DIAGNOSIS — F419 Anxiety disorder, unspecified: Secondary | ICD-10-CM | POA: Diagnosis not present

## 2016-06-21 DIAGNOSIS — R0789 Other chest pain: Secondary | ICD-10-CM | POA: Insufficient documentation

## 2016-06-21 DIAGNOSIS — R079 Chest pain, unspecified: Secondary | ICD-10-CM | POA: Diagnosis present

## 2016-06-21 DIAGNOSIS — M109 Gout, unspecified: Secondary | ICD-10-CM | POA: Diagnosis not present

## 2016-06-21 DIAGNOSIS — E781 Pure hyperglyceridemia: Secondary | ICD-10-CM | POA: Insufficient documentation

## 2016-06-21 LAB — BASIC METABOLIC PANEL
Anion gap: 9 (ref 5–15)
BUN: 15 mg/dL (ref 6–20)
CALCIUM: 9.1 mg/dL (ref 8.9–10.3)
CO2: 24 mmol/L (ref 22–32)
CREATININE: 1.21 mg/dL (ref 0.61–1.24)
Chloride: 103 mmol/L (ref 101–111)
GFR calc Af Amer: >60 mL/min (ref >60)
GFR calc non Af Amer: >60 mL/min (ref >60)
Glucose, Bld: 117 mg/dL — ABNORMAL HIGH (ref 65–99)
Potassium: 3.9 mmol/L (ref 3.5–5.1)
Sodium: 136 mmol/L (ref 135–145)

## 2016-06-21 LAB — CBC
HCT: 40.6 % (ref 39.0–52.0)
Hemoglobin: 14.3 g/dL (ref 13.0–17.0)
MCH: 31.6 pg (ref 26.0–34.0)
MCHC: 35.2 g/dL (ref 30.0–36.0)
MCV: 89.6 fL (ref 78.0–100.0)
Platelets: 175 10*3/uL (ref 150–400)
RBC: 4.53 MIL/uL (ref 4.22–5.81)
RDW: 13.1 % (ref 11.5–15.5)
WBC: 6.4 10*3/uL (ref 4.0–10.5)

## 2016-06-21 LAB — TROPONIN I: Troponin I: <0.03 ng/mL (ref <0.03)

## 2016-06-21 MED ORDER — BENAZEPRIL HCL 10 MG PO TABS
20.0000 mg | ORAL_TABLET | Freq: Every day | ORAL | Status: DC
  Administered 2016-06-22 – 2016-06-24 (×3): 20 mg via ORAL
  Filled 2016-06-21 (×4): qty 2

## 2016-06-21 MED ORDER — ASPIRIN EC 325 MG PO TBEC
325.0000 mg | DELAYED_RELEASE_TABLET | Freq: Every day | ORAL | Status: DC
  Administered 2016-06-22 – 2016-06-23 (×2): 325 mg via ORAL
  Filled 2016-06-21 (×2): qty 1

## 2016-06-21 MED ORDER — ENOXAPARIN SODIUM 40 MG/0.4ML ~~LOC~~ SOLN
40.0000 mg | SUBCUTANEOUS | Status: DC
  Administered 2016-06-21 – 2016-06-23 (×3): 40 mg via SUBCUTANEOUS
  Filled 2016-06-21 (×3): qty 0.4

## 2016-06-21 MED ORDER — ASPIRIN 81 MG PO CHEW
324.0000 mg | CHEWABLE_TABLET | Freq: Once | ORAL | Status: AC
  Administered 2016-06-21: 324 mg via ORAL
  Filled 2016-06-21: qty 4

## 2016-06-21 MED ORDER — ETANERCEPT 50 MG/ML ~~LOC~~ SOAJ: 50.0000 mg | SUBCUTANEOUS | Status: DC

## 2016-06-21 MED ORDER — SODIUM CHLORIDE 0.9% FLUSH
3.0000 mL | Freq: Two times a day (BID) | INTRAVENOUS | Status: DC
  Administered 2016-06-21 – 2016-06-23 (×5): 3 mL via INTRAVENOUS

## 2016-06-21 MED ORDER — LORATADINE 10 MG PO TABS
10.0000 mg | ORAL_TABLET | ORAL | Status: DC
  Administered 2016-06-22 – 2016-06-23 (×2): 10 mg via ORAL
  Filled 2016-06-21 (×3): qty 1

## 2016-06-21 MED ORDER — ACETAMINOPHEN 650 MG RE SUPP: 650.0000 mg | Freq: Four times a day (QID) | RECTAL | Status: DC | PRN

## 2016-06-21 MED ORDER — ALBUTEROL SULFATE (2.5 MG/3ML) 0.083% IN NEBU: 2.5000 mg | INHALATION_SOLUTION | RESPIRATORY_TRACT | Status: DC | PRN

## 2016-06-21 MED ORDER — ALLOPURINOL 100 MG PO TABS
100.0000 mg | ORAL_TABLET | Freq: Every day | ORAL | Status: DC
  Administered 2016-06-22 – 2016-06-24 (×3): 100 mg via ORAL
  Filled 2016-06-21 (×3): qty 1

## 2016-06-21 MED ORDER — ACETAMINOPHEN 325 MG PO TABS: 650.0000 mg | ORAL_TABLET | Freq: Four times a day (QID) | ORAL | Status: DC | PRN

## 2016-06-21 MED ORDER — ASPIRIN 81 MG PO CHEW: 324.0000 mg | CHEWABLE_TABLET | Freq: Once | ORAL | Status: DC

## 2016-06-21 MED ORDER — FOLIC ACID 1 MG PO TABS
1.0000 mg | ORAL_TABLET | Freq: Every day | ORAL | Status: DC
  Administered 2016-06-22 – 2016-06-24 (×3): 1 mg via ORAL
  Filled 2016-06-21 (×3): qty 1

## 2016-06-21 NOTE — H&P (Addendum)
History and Physical    Wayne Dixon BMW:[id number] DOB: 04/04/1955 DOA: 06/21/2016  PCP: Anthoney Harada, MD   I have briefly reviewed patients previous medical reports in [facility].  Patient coming from: Home  Chief Complaint: Chest discomfort  HPI: Wayne Dixon is a 61 year old male, former Scientist, clinical (histocompatibility and immunogenetics), physically very active, PMH of HTN, HLD, psoriatic arthritis, gout, allergies, status post hyperthyroidism, hyperparathyroidism status post parathyroidectomy, mild tobacco use, presented to ED with complaints of chest discomfort. Patient and his significant other are physically very active. They walk up to 3 miles 2 times a week, intermittently jog on a treadmill and do weight training twice a week. He was getting out of a store at 1 PM on day of admission when he experienced left-sided chest discomfort underneath the breast which she describes as "ache and not pain", rated at 2/10 in severity, nonradiating, associated with mild transient dizziness and lightheadedness and feeling like he was going to pass out but kept walking to his car and did not pass out. Discomfort was made slightly worse upon deep inspiration. After resting for a couple of minutes, this discomfort resolved after 5-6 minutes. He had a brief episode for 60 seconds again while sitting in his car. He then came to the emergency room where he had fleeting similar complaints lasting couple seconds each time. He denies dyspnea, chest tightness, wheezing or cough. He denies chest wall trauma or doing anything unusual. His girlfriend however indicates that they might have overdone during weight training last week. Currently he is asymptomatic of any complaints. He did treadmill stress test approximately 18 years ago while in the [place] which was negative. 2 years ago he had left-sided chest pain related to work-related stress and states that he was admitted here but did not undergo stress testing. He smokes mild and infrequently.  Drinks occasionally and socially. Denies drug abuse. Denies family history of CAD but his brother had congenital heart disease, V. fib/V. tach, ablation and a pacemaker but demised from suicide.  ED Course: POC troponin 1 negative. EKG without acute changes. Lab work unremarkable. Chest x-ray without acute findings.  Review of Systems:  All other systems reviewed and apart from HPI, are negative.  Past Medical History:  Diagnosis Date  . Allergy   . Anxiety   . Depression   . Elevated bilirubin   . Headache(784.0)    due to cervical disc problem  . History of gout   . History of kidney stones 2002  . Hypercalcemia   . Hypertension   . IBS (irritable bowel syndrome)   . Sleep apnea    mild doses not use c-pap  . Thyroid disease    hyperthyroid    Past Surgical History:  Procedure Laterality Date  . CHOLECYSTECTOMY  03/24/2012   Procedure: LAPAROSCOPIC CHOLECYSTECTOMY WITH INTRAOPERATIVE CHOLANGIOGRAM;  Surgeon: Rolm Bookbinder, MD;  Location: [facility];  Service: General;  Laterality: N/A;  laparascopic cholecystectomy  . COLONOSCOPY  2010  . MOLE REMOVAL  2000  . PARATHYROIDECTOMY  12/25/2011   Procedure: PARATHYROIDECTOMY;  Surgeon: Earnstine Regal, MD;  Location: [facility];  Service: General;  Laterality: N/A;    Social History  reports that he has been smoking Cigarettes.  He has been smoking about 0.15 packs per day. He quit smokeless tobacco use about 29 years ago. He reports that he drinks alcohol. He reports that he does not use drugs.  Allergies  Allergen Reactions  . Atorvastatin Other (See Comments)  Joint pain    Family history: As indicated above in HPI.  Prior to Admission medications   Medication Sig Start Date End Date Taking? Authorizing Provider  albuterol (PROVENTIL HFA;VENTOLIN HFA) 108 (90 Base) MCG/ACT inhaler Inhale 2 puffs into the lungs every 6 (six) hours as needed for wheezing or shortness of breath (allergies).   Yes Historical Provider, MD    allopurinol (ZYLOPRIM) 100 MG tablet Take 100 mg by mouth daily.   Yes Historical Provider, MD  benazepril (LOTENSIN) 20 MG tablet Take 20 mg by mouth daily. 06/02/16  Yes Historical Provider, MD  etanercept (ENBREL SURECLICK) 50 MG/ML injection Inject 50 mg into the skin every Saturday.   Yes Historical Provider, MD  folic acid (FOLVITE) 1 MG tablet Take 1 mg by mouth daily. 06/02/16  Yes Historical Provider, MD  hydrocortisone cream 1 % Apply 1 application topically daily as needed (heat rash).   Yes Historical Provider, MD  loratadine (CLARITIN) 10 MG tablet Take 10 mg by mouth every other day.   Yes Historical Provider, MD    Physical Exam: Vitals:   06/21/16 1610 06/21/16 1615 06/21/16 1700 06/21/16 1724  BP:  131/82 111/79 (!) 131/100  Pulse: 73 69 65 74  Resp: 17 15 11    Temp:    97.8 F (36.6 C)  TempSrc:    Oral  SpO2: 98% 97% 90% 98%  Weight:    107 kg (235 lb 14.4 oz)  Height:    6\' 2"  (1.88 m)      Constitutional: Pleasant middle-aged male, moderately built and nourished, lying comfortably propped up in bed. Eyes: PERTLA, lids and conjunctivae normal ENMT: Mucous membranes are moist. Posterior pharynx clear of any exudate or lesions. Normal dentition.  Neck: supple, no masses, no thyromegaly Respiratory: clear to auscultation bilaterally, no wheezing, no crackles. Normal respiratory effort. No accessory muscle use. No reproducible chest pain. Cardiovascular: S1 & S2 heard, regular rate and rhythm, no murmurs / rubs / gallops. No extremity edema. 2+ pedal pulses. No carotid bruits. Telemetry: Sinus rhythm. Abdomen: No distension, no tenderness, no masses palpated. No hepatosplenomegaly. Bowel sounds normal.  Musculoskeletal: no clubbing / cyanosis. No joint deformity upper and lower extremities. Good ROM, no contractures. Normal muscle tone.  Skin: no rashes, lesions, ulcers. No induration Neurologic: CN 2-12 grossly intact. Sensation intact, DTR normal. Strength 5/5 in all  4 limbs.  Psychiatric: Normal judgment and insight. Alert and oriented x 3. Normal mood.     Labs on Admission: I have personally reviewed following labs and imaging studies  CBC:  Recent Labs Lab 06/21/16 1348  WBC 6.4  HGB 14.3  HCT 40.6  MCV 89.6  PLT 836   Basic Metabolic Panel:  Recent Labs Lab 06/21/16 1348  NA 136  K 3.9  CL 103  CO2 24  GLUCOSE 117*  BUN 15  CREATININE 1.21  CALCIUM 9.1      Radiological Exams on Admission: Dg Chest 2 View  Result Date: 06/21/2016 CLINICAL DATA:  61 year old male with a history of left-sided chest pain EXAM: CHEST  2 VIEW COMPARISON:  07/31/2014 FINDINGS: Cardiomediastinal silhouette within normal limits. No evidence of central vascular congestion. No pleural effusion or pneumothorax.  No confluent airspace disease. Similar appearance of coarsened interstitial markings. No displaced fracture. IMPRESSION: No radiographic evidence of acute cardiopulmonary disease Electronically Signed   By: Corrie Mckusick D.O.   On: 06/21/2016 14:24    EKG: Independently reviewed. Normal sinus rhythm at 76 bpm, normal axis, Q  waves in leads 3, aVF and V1, no acute changes and QTC 400 ms.  Assessment/Plan Principal Problem:   Chest pain Active Problems:   Essential hypertension   Hyperlipidemia   Psoriatic arthritis (HCC)   Gout     1. Chest pain, atypical: Suspicious for musculoskeletal etiology but does have risk factors for CAD. Low index of suspicion for GERD, PE. Monitor on telemetry. Cycle troponins. Continue aspirin, when necessary sublingual NTG. Consider cardiology consultation in a.m. Upon review of records, seen in ED for atypical chest pain on 07/31/14. 2. Essential hypertension: Mildly uncontrolled. Continue home medications. 3. Hyperlipidemia: Not on statins at home. 4. Psoriatic arthritis: Well controlled on Enbrel. Follows with rheumatology outpatient. 5. Gout: No acute flare. 6. History of hyperthyroidism: Used to be on  medications which she weaned himself off several years ago. Clinically euthyroid.   DVT prophylaxis: Lovenox  Code Status: Full  Family Communication: Discussed in detail with patient's girlfriend at bedside.  Disposition Plan: Likely discharge home 3/25  Consults called: None  Admission status: Observation, telemetry.    [facility] MD [facility] Pager [phone]  If 7PM-7AM, please contact night-coverage [email or web address] Password [facility]  06/21/2016, 6:21 PM

## 2016-06-21 NOTE — ED Notes (Signed)
Patient is stable and ready to be transport to the floor at this time.  Report was called to Wayne Dixon.  Belongings taken with the patient to the floor.

## 2016-06-21 NOTE — ED Triage Notes (Signed)
Pt. Stated, I was coming out of the store and sit in the car and felt a chest pain right uner my left breast with some dizziness that lasted about 5 minutes.  Wife stated, he has had several times of dizziness. No symptoms at the present time.

## 2016-06-21 NOTE — ED Provider Notes (Signed)
[place] DEPT Provider Note   CSN: [id number] Arrival date & time: 06/21/16  1334     History   Chief Complaint Chief Complaint  Patient presents with  . Chest Pain  . Dizziness    HPI Wayne Dixon is a 61 y.o. male.Pain of left anterior chest pain while walking to his car this 1 PM afternoon lasted 5 minutes accompanied by lightheadedness. He's had 3 episodes of chest pain since the initial pain each lasting approximately 30 seconds. Also accompany by lightheadedness. He is presently asymptomatic without treatment. He's never had similar episodes before. Symptoms brought on file walking. No other associated symptoms. Nothing made symptoms better or worse. No treatment prior to coming here.  HPI  Past Medical History:  Diagnosis Date  . Allergy   . Anxiety   . Depression   . Elevated bilirubin   . Headache(784.0)    due to cervical disc problem  . History of gout   . History of kidney stones 2002  . Hypercalcemia   . Hypertension   . IBS (irritable bowel syndrome)   . Sleep apnea    mild doses not use c-pap  . Thyroid disease    hyperthyroid    Patient Active Problem List   Diagnosis Date Noted  . Hyperparathyroidism, primary ([place]) 11/24/2011    Past Surgical History:  Procedure Laterality Date  . CHOLECYSTECTOMY  03/24/2012   Procedure: LAPAROSCOPIC CHOLECYSTECTOMY WITH INTRAOPERATIVE CHOLANGIOGRAM;  Surgeon: Rolm Bookbinder, MD;  Location: [facility];  Service: General;  Laterality: N/A;  laparascopic cholecystectomy  . COLONOSCOPY  2010  . MOLE REMOVAL  2000  . PARATHYROIDECTOMY  12/25/2011   Procedure: PARATHYROIDECTOMY;  Surgeon: Earnstine Regal, MD;  Location: [facility];  Service: General;  Laterality: N/A;       Home Medications    Prior to Admission medications   Medication Sig Start Date End Date Taking? Authorizing Provider  allopurinol (ZYLOPRIM) 100 MG tablet Take 100 mg by mouth daily.    Historical Provider, MD  benazepril (LOTENSIN) 40 MG  tablet Take 40 mg by mouth daily with breakfast.     Historical Provider, MD  citalopram (CELEXA) 20 MG tablet Take 10 mg by mouth daily.     Historical Provider, MD  colchicine 0.6 MG tablet Take 0.6 mg by mouth 2 (two) times daily.    Historical Provider, MD  ENBREL SURECLICK 50 MG/ML injection Inject 50 mg as directed every 7 (seven) days.  06/09/14   Historical Provider, MD  Famotidine (PEPCID PO) Take 1 tablet by mouth once. OTC Pepcid    Historical Provider, MD  montelukast (SINGULAIR) 10 MG tablet Take 10 mg by mouth at bedtime.    Historical Provider, MD    Family History No family history on file.  Social History Social History  Substance Use Topics  . Smoking status: Current Every Day Smoker    Packs/day: 0.15    Types: Cigarettes  . Smokeless tobacco: Former Systems developer    Quit date: 10/17/1986  . Alcohol use Yes     Comment: occasional     Allergies   Atorvastatin   Review of Systems Review of Systems  Constitutional: Negative.   HENT: Negative.   Respiratory: Negative.   Cardiovascular: Positive for chest pain.  Gastrointestinal: Negative.   Musculoskeletal: Negative.   Skin: Negative.   Neurological: Positive for light-headedness.  Psychiatric/Behavioral: Negative.   All other systems reviewed and are negative.    Physical Exam Updated Vital Signs BP (!) 129/91  Pulse 73   Temp 98 F (36.7 C) (Oral)   Resp 17   Ht 6\' 2"  (1.88 m)   Wt 235 lb (106.6 kg)   SpO2 98%   BMI 30.17 kg/m   Physical Exam  Constitutional: He appears well-developed and well-nourished.  HENT:  Head: Normocephalic and atraumatic.  Eyes: Conjunctivae are normal. Pupils are equal, round, and reactive to light.  Neck: Neck supple. No tracheal deviation present. No thyromegaly present.  Cardiovascular: Normal rate and regular rhythm.   No murmur heard. Pulmonary/Chest: Effort normal and breath sounds normal.  Abdominal: Soft. Bowel sounds are normal. He exhibits no distension.  There is no tenderness.  Musculoskeletal: Normal range of motion. He exhibits no edema or tenderness.  Neurological: He is alert. Coordination normal.  Skin: Skin is warm and dry. No rash noted.  Psychiatric: He has a normal mood and affect.  Nursing note and vitals reviewed.    ED Treatments / Results  Labs (all labs ordered are listed, but only abnormal results are displayed) Labs Reviewed  BASIC METABOLIC PANEL - Abnormal; Notable for the following:       Result Value   Glucose, Bld 117 (*)    All other components within normal limits  CBC  I-STAT TROPOININ, ED    EKG  EKG Interpretation  Date/Time:  Saturday June 21 2016 13:41:34 EDT Ventricular Rate:  76 PR Interval:  190 QRS Duration: 108 QT Interval:  356 QTC Calculation: 400 R Axis:   36 Text Interpretation:  Normal sinus rhythm Minimal voltage criteria for LVH, may be normal variant Cannot rule out Inferior infarct , age undetermined Cannot rule out Anterior infarct , age undetermined Abnormal ECG inferior wall mi age indeterminate new since 07/31/14 Confirmed by Winfred Leeds  MD, Draden Cottingham [phone]) on 06/21/2016 3:55:53 PM       Radiology Dg Chest 2 View  Result Date: 06/21/2016 CLINICAL DATA:  61 year old male with a history of left-sided chest pain EXAM: CHEST  2 VIEW COMPARISON:  07/31/2014 FINDINGS: Cardiomediastinal silhouette within normal limits. No evidence of central vascular congestion. No pleural effusion or pneumothorax.  No confluent airspace disease. Similar appearance of coarsened interstitial markings. No displaced fracture. IMPRESSION: No radiographic evidence of acute cardiopulmonary disease Electronically Signed   By: Corrie Mckusick D.O.   On: 06/21/2016 14:24    Procedures Procedures (including critical care time)  Medications Ordered in ED Medications  aspirin chewable tablet 324 mg (not administered)   Chest x-ray viewed by me Results for orders placed or performed during the hospital encounter of  43/32/95  Basic metabolic panel  Result Value Ref Range   Sodium 136 135 - 145 mmol/L   Potassium 3.9 3.5 - 5.1 mmol/L   Chloride 103 101 - 111 mmol/L   CO2 24 22 - 32 mmol/L   Glucose, Bld 117 (H) 65 - 99 mg/dL   BUN 15 6 - 20 mg/dL   Creatinine, Ser 1.21 0.61 - 1.24 mg/dL   Calcium 9.1 8.9 - 10.3 mg/dL   GFR calc non Af Amer >60 >60 mL/min   GFR calc Af Amer >60 >60 mL/min   Anion gap 9 5 - 15  CBC  Result Value Ref Range   WBC 6.4 4.0 - 10.5 K/uL   RBC 4.53 4.22 - 5.81 MIL/uL   Hemoglobin 14.3 13.0 - 17.0 g/dL   HCT 40.6 39.0 - 52.0 %   MCV 89.6 78.0 - 100.0 fL   MCH 31.6 26.0 - 34.0 pg  MCHC 35.2 30.0 - 36.0 g/dL   RDW 13.1 11.5 - 15.5 %   Platelets 175 150 - 400 K/uL   Dg Chest 2 View  Result Date: 06/21/2016 CLINICAL DATA:  61 year old male with a history of left-sided chest pain EXAM: CHEST  2 VIEW COMPARISON:  07/31/2014 FINDINGS: Cardiomediastinal silhouette within normal limits. No evidence of central vascular congestion. No pleural effusion or pneumothorax.  No confluent airspace disease. Similar appearance of coarsened interstitial markings. No displaced fracture. IMPRESSION: No radiographic evidence of acute cardiopulmonary disease Electronically Signed   By: Corrie Mckusick D.O.   On: 06/21/2016 14:24  Chest x-ray viewed by me Initial i-STAT troponin 0 Initial Impression / Assessment and Plan / ED Course  I have reviewed the triage vital signs and the nursing notes.  Pertinent labs & imaging results that were available during my care of the patient were reviewed by me and considered in my medical decision making (see chart for details).      Cardiac risk factors include hypertension, smoker. Story moderately suspicious for ACS. Heart score equals 5 Dr Algis Liming consulted and will see patient in hospital plan 23 hour observation telemetry. Final Clinical Impressions(s) / ED Diagnoses   diagnosis exertional chest pain  Final diagnoses:  None    New  Prescriptions New Prescriptions   No medications on file     Orlie Dakin, MD 06/21/16 [phone]

## 2016-06-21 NOTE — ED Notes (Signed)
Conferred     With   [employer]    The  Provider  Send  Pt  To  [facility]

## 2016-06-22 ENCOUNTER — Observation Stay (HOSPITAL_BASED_OUTPATIENT_CLINIC_OR_DEPARTMENT_OTHER): Payer: BLUE CROSS/BLUE SHIELD

## 2016-06-22 ENCOUNTER — Encounter (HOSPITAL_COMMUNITY): Payer: Self-pay | Admitting: Cardiology

## 2016-06-22 DIAGNOSIS — I10 Essential (primary) hypertension: Secondary | ICD-10-CM

## 2016-06-22 DIAGNOSIS — R079 Chest pain, unspecified: Secondary | ICD-10-CM | POA: Diagnosis not present

## 2016-06-22 DIAGNOSIS — L4050 Arthropathic psoriasis, unspecified: Secondary | ICD-10-CM | POA: Diagnosis not present

## 2016-06-22 DIAGNOSIS — E782 Mixed hyperlipidemia: Secondary | ICD-10-CM | POA: Diagnosis not present

## 2016-06-22 LAB — NM MYOCAR MULTI W/SPECT W/WALL MOTION / EF
CHL CUP NUCLEAR SRS: 5
CHL CUP NUCLEAR SSS: 11
CHL CUP RESTING HR STRESS: 75 {beats}/min
CHL CUP STRESS STAGE 1 SPEED: 0 mph
CHL CUP STRESS STAGE 10 DBP: 92 mmHg
CHL CUP STRESS STAGE 10 GRADE: 0 %
CHL CUP STRESS STAGE 10 HR: 82 {beats}/min
CHL CUP STRESS STAGE 10 SPEED: 0 mph
CHL CUP STRESS STAGE 2 HR: 77 {beats}/min
CHL CUP STRESS STAGE 2 SPEED: 0 mph
CHL CUP STRESS STAGE 3 GRADE: 0 %
CHL CUP STRESS STAGE 3 SPEED: 1.1 mph
CHL CUP STRESS STAGE 4 SPEED: 1.2 mph
CHL CUP STRESS STAGE 5 SPEED: 1.7 mph
CHL CUP STRESS STAGE 6 GRADE: 12 %
CHL CUP STRESS STAGE 6 HR: 97 {beats}/min
CHL CUP STRESS STAGE 7 DBP: 81 mmHg
CHL CUP STRESS STAGE 7 HR: 125 {beats}/min
CHL CUP STRESS STAGE 8 SBP: 220 mmHg
CHL CUP STRESS STAGE 9 GRADE: 0 %
CHL RATE OF PERCEIVED EXERTION: 12
CSEPED: 11 min
CSEPEDS: 1 s
CSEPHR: 93 %
Estimated workload: 13.4 METS
LV dias vol: 69 mL (ref 62–150)
LV sys vol: 31 mL
MPHR: 161 {beats}/min
Peak BP: 220 mmHg
Peak HR: 150 {beats}/min
Percent of predicted max HR: 93 %
RATE: 0.3
SDS: 6
Stage 1 DBP: 98 mmHg
Stage 1 Grade: 0 %
Stage 1 HR: 75 {beats}/min
Stage 1 SBP: 142 mmHg
Stage 10 SBP: 185 mmHg
Stage 2 Grade: 0 %
Stage 3 HR: 77 {beats}/min
Stage 4 Grade: 0 %
Stage 4 HR: 77 {beats}/min
Stage 5 DBP: 91 mmHg
Stage 5 Grade: 10 %
Stage 5 HR: 86 {beats}/min
Stage 5 SBP: 180 mmHg
Stage 6 Speed: 2.5 mph
Stage 7 Grade: 14 %
Stage 7 SBP: 181 mmHg
Stage 7 Speed: 3.4 mph
Stage 8 DBP: 84 mmHg
Stage 8 Grade: 16 %
Stage 8 HR: 150 {beats}/min
Stage 8 Speed: 4.2 mph
Stage 9 HR: 123 {beats}/min
Stage 9 Speed: 0 mph
TID: 1.22

## 2016-06-22 LAB — HIV ANTIBODY (ROUTINE TESTING W REFLEX): HIV Screen 4th Generation wRfx: NONREACTIVE

## 2016-06-22 LAB — TROPONIN I

## 2016-06-22 MED ORDER — SODIUM CHLORIDE 0.9 % IV SOLN: 250.0000 mL | INTRAVENOUS | Status: DC | PRN

## 2016-06-22 MED ORDER — METOPROLOL TARTRATE 12.5 MG HALF TABLET
12.5000 mg | ORAL_TABLET | Freq: Two times a day (BID) | ORAL | Status: DC
  Administered 2016-06-22 – 2016-06-24 (×3): 12.5 mg via ORAL
  Filled 2016-06-22 (×4): qty 1

## 2016-06-22 MED ORDER — SODIUM CHLORIDE 0.9 % WEIGHT BASED INFUSION
1.0000 mL/kg/h | INTRAVENOUS | Status: DC
  Administered 2016-06-23: 1 mL/kg/h via INTRAVENOUS

## 2016-06-22 MED ORDER — TECHNETIUM TC 99M TETROFOSMIN IV KIT
10.0000 | PACK | Freq: Once | INTRAVENOUS | Status: AC | PRN
  Administered 2016-06-22: 10 via INTRAVENOUS

## 2016-06-22 MED ORDER — BENAZEPRIL HCL 40 MG PO TABS: 40.0000 mg | ORAL_TABLET | Freq: Every day | ORAL | 0 refills | Status: DC

## 2016-06-22 MED ORDER — ASPIRIN 81 MG PO CHEW
81.0000 mg | CHEWABLE_TABLET | ORAL | Status: AC
  Administered 2016-06-23: 81 mg via ORAL
  Filled 2016-06-22: qty 1

## 2016-06-22 MED ORDER — SODIUM CHLORIDE 0.9% FLUSH
3.0000 mL | Freq: Two times a day (BID) | INTRAVENOUS | Status: DC
  Administered 2016-06-22: 3 mL via INTRAVENOUS

## 2016-06-22 MED ORDER — SODIUM CHLORIDE 0.9 % WEIGHT BASED INFUSION
3.0000 mL/kg/h | INTRAVENOUS | Status: DC
  Administered 2016-06-23: 3 mL/kg/h via INTRAVENOUS

## 2016-06-22 MED ORDER — TECHNETIUM TC 99M TETROFOSMIN IV KIT
30.0000 | PACK | Freq: Once | INTRAVENOUS | Status: AC | PRN
  Administered 2016-06-22: 30 via INTRAVENOUS

## 2016-06-22 MED ORDER — SODIUM CHLORIDE 0.9% FLUSH: 3.0000 mL | INTRAVENOUS | Status: DC | PRN

## 2016-06-22 NOTE — Consult Note (Signed)
Cardiology Consult    Patient ID: Wayne Dixon MRN: [id number], DOB/AGE: 1956-01-05   Admit date: 06/21/2016 Date of Consult: 06/22/2016  Primary Physician: Anthoney Harada, MD Reason for Consult: Chest pain Primary Cardiologist: New Requesting Provider: Dr. Sloan Leiter  History of Present Illness    Wayne Dixon is a 61 y.o. with past medical history significant for HTN, HLD, psoriatic arthritis, gout, S/P hyperthyroidism, S/P parathyroidectomy who was admitted with complaints of left sided chest discomfort. 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   At 1 pm yesterday he developed a dull ache in the left chest associated with lightheadedness while walking to his car. It lasted 2-3 minutes and reoccurred for shorter duration at which time he presented tot he ED. This occurred 3 more times in the ED and only lasting 4-5 seconds. He is quite active doing cardio and weight training at a gym 2 times per week and brisk walking 3 miles twice a week. He has had no exertional CP or dyspnea while exercising. No orthpnea, edema, PND or palpitations. No diabetes. No recent labs available. Pt states has mildly elevated LDL and low HDL. No current treatment. Non smoker. He has no significant family cardiac history.  He had similar symptoms a couple of years ago, was seen in the ED, and was thought to be related to stress.  EKG: NSR 76 bpm, ?Q waves inferiorly, abnormal r wave progression CXR no active cardiopulmonary disease Troponins negative X 4 Normal kidney function and electolytes  Past Medical History   Past Medical History:  Diagnosis Date  . Allergy   . Anxiety   . Depression   . Elevated bilirubin   . Headache(784.0)    due to cervical disc problem  . History of gout   . History of kidney stones 2002  . Hypercalcemia   . Hypertension   . IBS (irritable bowel syndrome)   . Sleep apnea    mild doses not use c-pap  . Thyroid disease    hyperthyroid    Past  Surgical History:  Procedure Laterality Date  . CHOLECYSTECTOMY  03/24/2012   Procedure: LAPAROSCOPIC CHOLECYSTECTOMY WITH INTRAOPERATIVE CHOLANGIOGRAM;  Surgeon: Rolm Bookbinder, MD;  Location: [facility];  Service: General;  Laterality: N/A;  laparascopic cholecystectomy  . COLONOSCOPY  2010  . MOLE REMOVAL  2000  . PARATHYROIDECTOMY  12/25/2011   Procedure: PARATHYROIDECTOMY;  Surgeon: Earnstine Regal, MD;  Location: [facility];  Service: General;  Laterality: N/A;     Allergies  Allergies  Allergen Reactions  . Atorvastatin Other (See Comments)    Joint pain    Inpatient Medications    . allopurinol  100 mg Oral Daily  . aspirin EC  325 mg Oral Daily  . benazepril  20 mg Oral Daily  . enoxaparin (LOVENOX) injection  40 mg Subcutaneous Q24H  . folic acid  1 mg Oral Daily  . loratadine  10 mg Oral QODAY  . sodium chloride flush  3 mL Intravenous Q12H    Family History    Family History  Problem Relation Age of Onset  . Cancer Father     Social History    Social History   Social History  . Marital status: Married    Spouse name: N/A  . Number of children: N/A  . Years of education: N/A   Occupational History  . retired     Scientist, clinical (histocompatibility and immunogenetics)   Social History Main Topics  . Smoking status:  Former Smoker    Packs/day: 0.15    Types: Cigarettes  . Smokeless tobacco: Former Systems developer    Quit date: 10/17/1986  . Alcohol use Yes     Comment: occasional  . Drug use: No  . Sexual activity: Not on file   Other Topics Concern  . Not on file   Social History Narrative  . No narrative on file     Review of Systems    General:  No chills, fever, night sweats or weight changes.  Cardiovascular:  Chest pain as about No  dyspnea on exertion, edema, orthopnea, palpitations, paroxysmal nocturnal dyspnea. Dermatological: No rash, lesions/masses Respiratory: No cough, dyspnea Urologic: No hematuria, dysuria Abdominal:   No nausea, vomiting, diarrhea, bright red blood per rectum,  melena, or hematemesis Neurologic:  No visual changes, wkns, changes in mental status. All other systems reviewed and are otherwise negative except as noted above.  Physical Exam   Blood pressure 106/65, pulse 61, temperature 97.5 F (36.4 C), temperature source Oral, resp. rate 11, height 6\' 2"  (1.88 m), weight 235 lb 14.4 oz (107 kg), SpO2 96 %.  General: Pleasant, NAD Psych: Normal affect. Neuro: Alert and oriented X 3. Moves all extremities spontaneously. HEENT: Normal  Neck: Supple without bruits or JVD. Lungs:  Resp regular and unlabored, CTA. Heart: RRR no s3, s4, or murmurs. Abdomen: Soft, non-tender, non-distended, BS + x 4.  Extremities: No clubbing, cyanosis or edema. DP/PT/Radials 2+ and equal bilaterally.  Labs    Troponin (Point of Care Test) No results for input(s): TROPIPOC in the last 72 hours.  Recent Labs  06/21/16 1829 06/21/16 2358 06/22/16 0601  TROPONINI <0.03 <0.03 <0.03   Lab Results  Component Value Date   WBC 6.4 06/21/2016   HGB 14.3 06/21/2016   HCT 40.6 06/21/2016   MCV 89.6 06/21/2016   PLT 175 06/21/2016     Recent Labs Lab 06/21/16 1348  NA 136  K 3.9  CL 103  CO2 24  BUN 15  CREATININE 1.21  CALCIUM 9.1  GLUCOSE 117*   No results found for: CHOL, HDL, LDLCALC, TRIG No results found for: [facility]   Radiology Studies    Dg Chest 2 View  Result Date: 06/21/2016 CLINICAL DATA:  61 year old male with a history of left-sided chest pain EXAM: CHEST  2 VIEW COMPARISON:  07/31/2014 FINDINGS: Cardiomediastinal silhouette within normal limits. No evidence of central vascular congestion. No pleural effusion or pneumothorax.  No confluent airspace disease. Similar appearance of coarsened interstitial markings. No displaced fracture. IMPRESSION: No radiographic evidence of acute cardiopulmonary disease Electronically Signed   By: Corrie Mckusick D.O.   On: 06/21/2016 14:24    EKG & Cardiac Imaging   EKG: NSR 76 bpm, ?Q waves inferiorly,  abnormal r wave progression  Echocardiogram: none  Assessment & Plan    Chest pain, atypical -No cardiac history. Very active person. Developed left sided chest dull ache with lightheadedness.  -Toponins negative X 3 -CXR: No radiographic evidence of acute cardiopulmonary disease -EKG NSR 76 bpm, ?Q waves inferiorly, abnormal r wave progression- New changes. -Massiel Stipp evaluate for myocardial ischemia with nuclear stress test  HTN -Treated with benazepril 20 mg  HLD -No recent labs available. Pt states has mildly elevated LDL and low HDL. No current treatment.     Tildon Husky, NP-C 06/22/2016, 9:48 AM Pager: [phone]  I have seen and examined this patient with Daune Perch.  Agree with above, note added to reflect my findings.  On exam, regular rhythm, no murmurs, lungs clear. Presented to the hospital with chest pain with exertion. Had 3 further episodes of chest pain while in the emergency room. Initial episode of chest pain was associated with dizziness when he was walking to his car. He does exercise with walking 3 miles, and does not have chest pain with walking. His troponins have been negative thus far but his EKG does show new Q waves in inferior leads. We'll plan for nuclear stress testing today.  Berea Majkowski M. Rontavious Albright MD 06/22/2016 10:15 AM

## 2016-06-22 NOTE — Progress Notes (Signed)
PROGRESS NOTE        PATIENT DETAILS Name: Wayne Dixon Age: 61 y.o. Sex: male Date of Birth: [birth year] Admit Date: 06/21/2016 Admitting Physician Modena Jansky, MD ZOX:WRUE,[id number] PATRICK, MD  Brief Narrative: Patient is a 61 y.o. 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   Subjective: No chest pain or SOB  Assessment/Plan: Atypical Chest pain: chest pain free since admission. Troponin negative. Seen by cardiology-underwent Nuclear Stress Test-this was read as high risk, LHC planned for tomorrow.Continue ASA,   Essential hypertension:Uncontrolled-will add low dose metoprolol, continue Benazepril-follow  Psoriatic arthritis: Well controlled on Enbrel. Follows with rheumatology outpatie   DVT Prophylaxis: Prophylactic Lovenox   Code Status: Full code  Family Communication: Significant other at bedside  Disposition Plan: Remain inpatient-home when work up is complete  Antimicrobial agents: Anti-infectives    None      Procedures: None  CONSULTS:  cardiology  Time spent: 25- minutes-Greater than 50% of this time was spent in counseling, explanation of diagnosis, planning of further management, and coordination of care.  MEDICATIONS: Scheduled Meds: . allopurinol  100 mg Oral Daily  . aspirin EC  325 mg Oral Daily  . benazepril  20 mg Oral Daily  . enoxaparin (LOVENOX) injection  40 mg Subcutaneous Q24H  . folic acid  1 mg Oral Daily  . loratadine  10 mg Oral QODAY  . sodium chloride flush  3 mL Intravenous Q12H   Continuous Infusions: PRN Meds:.acetaminophen **OR** acetaminophen, albuterol   PHYSICAL EXAM: Vital signs: Vitals:   06/22/16 1105 06/22/16 1108 06/22/16 1110 06/22/16 1400  BP: (!) 249/72 (!) 238/98 (!) 185/92 127/86  Pulse:    71  Resp:    20  Temp:    97.7 F (36.5 C)  TempSrc:    Oral  SpO2:    95%  Weight:      Height:       Filed Weights   06/21/16 1347  06/21/16 1724 06/22/16 0500  Weight: 106.6 kg (235 lb) 107 kg (235 lb 14.4 oz) 107 kg (235 lb 14.4 oz)   Body mass index is 30.29 kg/m.   General appearance :Awake, alert, not in any distress. Speech Clear. Not toxic Looking Eyes:, pupils equally reactive to light and accomodation,no scleral icterus.Pink conjunctiva HEENT: Atraumatic and Normocephalic Neck: supple, no JVD. No cervical lymphadenopathy. No thyromegaly Resp:Good air entry bilaterally, no added sounds  CVS: S1 S2 regular, no murmurs.  GI: Bowel sounds present, Non tender and not distended with no gaurding, rigidity or rebound.No organomegaly Extremities: B/L Lower Ext shows no edema, both legs are warm to touch Neurology:  speech clear,Non focal, sensation is grossly intact. Psychiatric: Normal judgment and insight. Alert and oriented x 3. Normal mood. Musculoskeletal:No digital cyanosis Skin:No Rash, warm and dry Wounds:N/A  I have personally reviewed following labs and imaging studies  LABORATORY DATA: CBC:  Recent Labs Lab 06/21/16 1348  WBC 6.4  HGB 14.3  HCT 40.6  MCV 89.6  PLT 191    Basic Metabolic Panel:  Recent Labs Lab 06/21/16 1348  NA 136  K 3.9  CL 103  CO2 24  GLUCOSE 117*  BUN 15  CREATININE 1.21  CALCIUM 9.1    GFR: Estimated Creatinine Clearance: 85.6 mL/min (by C-G formula based on SCr of 1.21 mg/dL).  Liver Function Tests:  No results for input(s): AST, ALT, ALKPHOS, BILITOT, PROT, ALBUMIN in the last 168 hours. No results for input(s): LIPASE, AMYLASE in the last 168 hours. No results for input(s): AMMONIA in the last 168 hours.  Coagulation Profile: No results for input(s): INR, PROTIME in the last 168 hours.  Cardiac Enzymes:  Recent Labs Lab 06/21/16 1829 06/21/16 2358 06/22/16 0601  TROPONINI <0.03 <0.03 <0.03    BNP (last 3 results) No results for input(s): PROBNP in the last 8760 hours.  HbA1C: No results for input(s): HGBA1C in the last 72  hours.  CBG: No results for input(s): GLUCAP in the last 168 hours.  Lipid Profile: No results for input(s): CHOL, HDL, LDLCALC, TRIG, CHOLHDL, LDLDIRECT in the last 72 hours.  Thyroid Function Tests: No results for input(s): TSH, T4TOTAL, FREET4, T3FREE, THYROIDAB in the last 72 hours.  Anemia Panel: No results for input(s): VITAMINB12, FOLATE, FERRITIN, TIBC, IRON, RETICCTPCT in the last 72 hours.  Urine analysis:    Component Value Date/Time   COLORURINE AMBER (A) 04/09/2012 1920   APPEARANCEUR HAZY (A) 04/09/2012 1920   LABSPEC 1.023 04/09/2012 1920   PHURINE 8.0 04/09/2012 1920   GLUCOSEU NEGATIVE 04/09/2012 1920   HGBUR NEGATIVE 04/09/2012 1920   BILIRUBINUR SMALL (A) 04/09/2012 1920   KETONESUR NEGATIVE 04/09/2012 1920   PROTEINUR NEGATIVE 04/09/2012 1920   UROBILINOGEN 1.0 04/09/2012 1920   NITRITE NEGATIVE 04/09/2012 1920   LEUKOCYTESUR NEGATIVE 04/09/2012 1920    Sepsis Labs: Lactic Acid, Venous No results found for: LATICACIDVEN  MICROBIOLOGY: No results found for this or any previous visit (from the past 240 hour(s)).  RADIOLOGY STUDIES/RESULTS: Dg Chest 2 View  Result Date: 06/21/2016 CLINICAL DATA:  61 year old male with a history of left-sided chest pain EXAM: CHEST  2 VIEW COMPARISON:  07/31/2014 FINDINGS: Cardiomediastinal silhouette within normal limits. No evidence of central vascular congestion. No pleural effusion or pneumothorax.  No confluent airspace disease. Similar appearance of coarsened interstitial markings. No displaced fracture. IMPRESSION: No radiographic evidence of acute cardiopulmonary disease Electronically Signed   By: Corrie Mckusick D.O.   On: 06/21/2016 14:24   Nm Myocar Multi W/spect W/wall Motion / Ef  Result Date: 06/22/2016  Blood pressure demonstrated a normal response to exercise.  There was no ST segment deviation noted during stress.  Defect 1: There is a large defect of severe severity present in the basal inferior, basal  inferolateral, mid inferior, mid inferolateral and apical inferior location.  Defect 2: There is a medium defect of mild severity present in the basal anterior, basal anterolateral, mid anterior and mid anterolateral location.  This is a high risk study.  The left ventricular ejection fraction is normal (55-65%).  Findings consistent with ischemia.  Excellent exercise tolerance. Hypertensive response to stress. There are two defects: 1. A large area, severe severity reversible defect in the basal and mid inferior, inferolateral and apical inferior walls, consistent with ischemia in the RCA territory. 2. A medium size, mild severity, reversible defect in the basal and mid anterior and anterolateral walls consistent with ischemia in the LAD territory. 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 A cardiac catheterization is recommended.     LOS: 0 days   Oren Binet, MD  [facility] Pager:[phone]  If 7PM-7AM, please contact night-coverage [email or web address] Password [facility] 06/22/2016, 2:32 PM

## 2016-06-22 NOTE — Plan of Care (Signed)
Problem: Cardiovascular: Goal: Ability to achieve and maintain adequate cardiovascular perfusion will improve Outcome: Progressing Plan Cardiac Cath tomorrow.

## 2016-06-22 NOTE — Progress Notes (Addendum)
   Nuclear stress test shows shows  1. A large area, severe severity reversible defect in the basal and mid inferior, inferolateral and apical inferior walls, consistent with ischemia in the RCA territory. 2. A medium size, mild severity, reversible defect in the basal and mid anterior and anterolateral walls consistent with ischemia in the LAD territory.  Per previous discussion with Dr. Curt Bears would need cath if Nuc abnormal.  Cardiac cath recommended for definitive cardiac evaluation. Discussed with patient. The patient understands that risks included but are not limited to stroke (1 in 1000), death (1 in 81), kidney failure [usually temporary] (1 in 500), bleeding (1 in 200), allergic reaction [possibly serious] (1 in 200). He agrees to proceed. Orders placed and I will put pt on the cath board for tomorrow.   Daune Perch, AGNP-C 3/25/20182:26 PM Pager: [phone]

## 2016-06-23 ENCOUNTER — Encounter (HOSPITAL_COMMUNITY)
Admission: EM | Disposition: A | Discharge status: Home / Self Care | Payer: Self-pay | Source: Home / Self Care | Attending: Emergency Medicine

## 2016-06-23 DIAGNOSIS — L4050 Arthropathic psoriasis, unspecified: Secondary | ICD-10-CM | POA: Diagnosis not present

## 2016-06-23 DIAGNOSIS — R079 Chest pain, unspecified: Secondary | ICD-10-CM | POA: Diagnosis not present

## 2016-06-23 DIAGNOSIS — R9439 Abnormal result of other cardiovascular function study: Secondary | ICD-10-CM

## 2016-06-23 DIAGNOSIS — E782 Mixed hyperlipidemia: Secondary | ICD-10-CM | POA: Diagnosis not present

## 2016-06-23 DIAGNOSIS — I10 Essential (primary) hypertension: Secondary | ICD-10-CM | POA: Diagnosis not present

## 2016-06-23 HISTORY — PX: LEFT HEART CATH AND CORONARY ANGIOGRAPHY: CATH118249

## 2016-06-23 LAB — COMPREHENSIVE METABOLIC PANEL
ALBUMIN: 3.7 g/dL (ref 3.5–5.0)
ALT: 38 U/L (ref 17–63)
AST: 29 U/L (ref 15–41)
Alkaline Phosphatase: 52 U/L (ref 38–126)
Anion gap: 10 (ref 5–15)
BILIRUBIN TOTAL: 1.7 mg/dL — AB (ref 0.3–1.2)
BUN: 15 mg/dL (ref 6–20)
CO2: 24 mmol/L (ref 22–32)
Calcium: 9 mg/dL (ref 8.9–10.3)
Chloride: 103 mmol/L (ref 101–111)
Creatinine, Ser: 1.18 mg/dL (ref 0.61–1.24)
GFR calc Af Amer: >60 mL/min (ref >60)
GFR calc non Af Amer: >60 mL/min (ref >60)
GLUCOSE: 133 mg/dL — AB (ref 65–99)
POTASSIUM: 3.9 mmol/L (ref 3.5–5.1)
SODIUM: 137 mmol/L (ref 135–145)
TOTAL PROTEIN: 6.4 g/dL — AB (ref 6.5–8.1)

## 2016-06-23 LAB — POCT I-STAT TROPONIN I: TROPONIN I, POC: 0 ng/mL (ref 0.00–0.08)

## 2016-06-23 LAB — PROTIME-INR
INR: 0.98
PROTHROMBIN TIME: 13 s (ref 11.4–15.2)

## 2016-06-23 LAB — LIPID PANEL
CHOL/HDL RATIO: 8.1 ratio
Cholesterol: 210 mg/dL — ABNORMAL HIGH (ref 0–200)
HDL: 26 mg/dL — AB (ref >40)
LDL Cholesterol: UNDETERMINED mg/dL (ref 0–99)
Triglycerides: 435 mg/dL — ABNORMAL HIGH (ref <150)
VLDL: UNDETERMINED mg/dL (ref 0–40)

## 2016-06-23 SURGERY — LEFT HEART CATH AND CORONARY ANGIOGRAPHY
Anesthesia: LOCAL

## 2016-06-23 MED ORDER — SODIUM CHLORIDE 0.9% FLUSH: 3.0000 mL | INTRAVENOUS | Status: DC | PRN

## 2016-06-23 MED ORDER — SODIUM CHLORIDE 0.9 % IV SOLN: 250.0000 mL | INTRAVENOUS | Status: DC | PRN

## 2016-06-23 MED ORDER — IOPAMIDOL (ISOVUE-370) INJECTION 76%
INTRAVENOUS | Status: AC
  Filled 2016-06-23: qty 100

## 2016-06-23 MED ORDER — FENTANYL CITRATE (PF) 100 MCG/2ML IJ SOLN
INTRAMUSCULAR | Status: DC | PRN
  Administered 2016-06-23: 25 ug via INTRAVENOUS

## 2016-06-23 MED ORDER — ROSUVASTATIN CALCIUM 10 MG PO TABS
20.0000 mg | ORAL_TABLET | Freq: Every day | ORAL | Status: DC
  Administered 2016-06-23: 20 mg via ORAL
  Filled 2016-06-23: qty 2

## 2016-06-23 MED ORDER — HEPARIN (PORCINE) IN NACL 2-0.9 UNIT/ML-% IJ SOLN
INTRAMUSCULAR | Status: DC | PRN
  Administered 2016-06-23: 1000 mL

## 2016-06-23 MED ORDER — VERAPAMIL HCL 2.5 MG/ML IV SOLN
INTRAVENOUS | Status: AC
  Filled 2016-06-23: qty 2

## 2016-06-23 MED ORDER — IOPAMIDOL (ISOVUE-370) INJECTION 76%
INTRAVENOUS | Status: DC | PRN
  Administered 2016-06-23: 90 mL via INTRA_ARTERIAL

## 2016-06-23 MED ORDER — ONDANSETRON HCL 4 MG/2ML IJ SOLN: 4.0000 mg | Freq: Four times a day (QID) | INTRAMUSCULAR | Status: DC | PRN

## 2016-06-23 MED ORDER — LIDOCAINE HCL (PF) 1 % IJ SOLN
INTRAMUSCULAR | Status: DC | PRN
  Administered 2016-06-23: 2 mL

## 2016-06-23 MED ORDER — HEPARIN (PORCINE) IN NACL 2-0.9 UNIT/ML-% IJ SOLN
INTRAMUSCULAR | Status: AC
  Filled 2016-06-23: qty 1000

## 2016-06-23 MED ORDER — HEPARIN SODIUM (PORCINE) 1000 UNIT/ML IJ SOLN
INTRAMUSCULAR | Status: DC | PRN
  Administered 2016-06-23: 5000 [IU] via INTRAVENOUS

## 2016-06-23 MED ORDER — LIDOCAINE HCL (PF) 1 % IJ SOLN
INTRAMUSCULAR | Status: AC
Start: 2016-06-23 — End: 2016-06-23
  Filled 2016-06-23: qty 30

## 2016-06-23 MED ORDER — FENTANYL CITRATE (PF) 100 MCG/2ML IJ SOLN
INTRAMUSCULAR | Status: AC
  Filled 2016-06-23: qty 2

## 2016-06-23 MED ORDER — ASPIRIN 81 MG PO CHEW
81.0000 mg | CHEWABLE_TABLET | Freq: Every day | ORAL | Status: DC
  Administered 2016-06-24: 81 mg via ORAL
  Filled 2016-06-23: qty 1

## 2016-06-23 MED ORDER — MIDAZOLAM HCL 2 MG/2ML IJ SOLN
INTRAMUSCULAR | Status: AC
  Filled 2016-06-23: qty 2

## 2016-06-23 MED ORDER — VERAPAMIL HCL 2.5 MG/ML IV SOLN
INTRAVENOUS | Status: DC | PRN
  Administered 2016-06-23: 10 mL via INTRA_ARTERIAL

## 2016-06-23 MED ORDER — SODIUM CHLORIDE 0.9 % IV SOLN
INTRAVENOUS | Status: AC
  Administered 2016-06-23: 22:00:00 via INTRAVENOUS

## 2016-06-23 MED ORDER — MIDAZOLAM HCL 2 MG/2ML IJ SOLN
INTRAMUSCULAR | Status: DC | PRN
  Administered 2016-06-23: 2 mg via INTRAVENOUS

## 2016-06-23 MED ORDER — ACETAMINOPHEN 325 MG PO TABS: 650.0000 mg | ORAL_TABLET | ORAL | Status: DC | PRN

## 2016-06-23 MED ORDER — SODIUM CHLORIDE 0.9% FLUSH: 3.0000 mL | Freq: Two times a day (BID) | INTRAVENOUS | Status: DC

## 2016-06-23 MED ORDER — HEPARIN SODIUM (PORCINE) 1000 UNIT/ML IJ SOLN
INTRAMUSCULAR | Status: AC
  Filled 2016-06-23: qty 1

## 2016-06-23 SURGICAL SUPPLY — 11 items
CATH 5FR JL3.5 JR4 ANG PIG MP (CATHETERS) ×2 IMPLANT
DEVICE RAD COMP TR BAND LRG (VASCULAR PRODUCTS) ×2 IMPLANT
GLIDESHEATH SLEND A-KIT 6F 22G (SHEATH) ×2 IMPLANT
GLIDESHEATH SLEND SS 6F .021 (SHEATH) ×2 IMPLANT
GUIDEWIRE INQWIRE 1.5J.035X260 (WIRE) ×1 IMPLANT
INQWIRE 1.5J .035X260CM (WIRE) ×2
KIT HEART LEFT (KITS) ×2 IMPLANT
PACK CARDIAC CATHETERIZATION (CUSTOM PROCEDURE TRAY) ×2 IMPLANT
SYR MEDRAD MARK V 150ML (SYRINGE) ×2 IMPLANT
TRANSDUCER W/STOPCOCK (MISCELLANEOUS) ×2 IMPLANT
TUBING CIL FLEX 10 FLL-RA (TUBING) ×2 IMPLANT

## 2016-06-23 NOTE — Progress Notes (Signed)
   06/23/16 1000  Clinical Encounter Type  Visited With Patient and family together  Visit Type Initial  Referral From Patient  Consult/Referral To Chaplain  Spiritual Encounters  Spiritual Needs Literature;Brochure  Stress Factors  Patient Stress Factors None identified  Pt completed AD.  No follow up needed.  Chaplain Xane Amsden A. Ousmane Seeman [phone]

## 2016-06-23 NOTE — Progress Notes (Signed)
PROGRESS NOTE        PATIENT DETAILS Name: Wayne Dixon Age: 61 y.o. Sex: male Date of Birth: 1956-02-02 Admit Date: 06/21/2016 Admitting Physician Modena Jansky, MD OYD:XAJO,INOMVEH PATRICK, MD  Brief Narrative: Patient is a 61 y.o. 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   Subjective: Some intermittent sharp pains overnight.  Assessment/Plan: Atypical Chest pain: chest pain free since admission. Troponin negative. Seen by cardiology-underwent Nuclear Stress Test-this was read as high risk, LHC planned for today.Continue ASA  Hypertriglyceridemia: Has been started on statin today-await LHC results-may need addition of fibrates  Essential hypertension:Controlled with metoprolol and benazepril.   Psoriatic arthritis: Well controlled on Enbrel. Follows with rheumatology outpatie   DVT Prophylaxis: Prophylactic Lovenox   Code Status: Full code  Family Communication: Significant other at bedside  Disposition Plan: Remain inpatient-home when work up is complete  Antimicrobial agents: Anti-infectives    None      Procedures: None  CONSULTS:  cardiology  Time spent: 25- minutes-Greater than 50% of this time was spent in counseling, explanation of diagnosis, planning of further management, and coordination of care.  MEDICATIONS: Scheduled Meds: . allopurinol  100 mg Oral Daily  . aspirin EC  325 mg Oral Daily  . benazepril  20 mg Oral Daily  . enoxaparin (LOVENOX) injection  40 mg Subcutaneous Q24H  . folic acid  1 mg Oral Daily  . loratadine  10 mg Oral QODAY  . metoprolol tartrate  12.5 mg Oral BID  . rosuvastatin  20 mg Oral q1800  . sodium chloride flush  3 mL Intravenous Q12H  . sodium chloride flush  3 mL Intravenous Q12H   Continuous Infusions: . sodium chloride 1 mL/kg/hr (06/23/16 0704)   PRN Meds:.sodium chloride, acetaminophen **OR** acetaminophen, albuterol, sodium  chloride flush   PHYSICAL EXAM: Vital signs: Vitals:   06/22/16 1110 06/22/16 1400 06/22/16 2222 06/23/16 0543  BP: (!) 185/92 127/86 123/89 115/70  Pulse:  71 74 67  Resp:  20    Temp:  97.7 F (36.5 C)  97.6 F (36.4 C)  TempSrc:  Oral  Oral  SpO2:  95%  97%  Weight:    106.3 kg (234 lb 4.8 oz)  Height:       Filed Weights   06/21/16 1724 06/22/16 0500 06/23/16 0543  Weight: 107 kg (235 lb 14.4 oz) 107 kg (235 lb 14.4 oz) 106.3 kg (234 lb 4.8 oz)   Body mass index is 30.08 kg/m.   General appearance :Awake, alert, not in any distress. Speech Clear. Eyes:, pupils equally reactive to light and accomodation,no scleral icterus. HEENT: Atraumatic and Normocephalic Neck: supple, no JVD. No cervical lymphadenopathy.  Resp:Good air entry bilaterally, no rales CVS: S1 S2 regular, no murmurs.  GI: Bowel sounds present, Non tender and not distended with no gaurding, rigidity or rebound.No organomegaly Extremities: B/L Lower Ext shows no edema, both legs are warm to touch Neurology:  speech clear,Non focal, sensation is grossly intact. Psychiatric: Normal judgment and insight. Alert and oriented x 3. Normal mood. Musculoskeletal:No digital cyanosis Skin:No Rash, warm and dry Wounds:N/A  I have personally reviewed following labs and imaging studies  LABORATORY DATA: CBC:  Recent Labs Lab 06/21/16 1348  WBC 6.4  HGB 14.3  HCT 40.6  MCV 89.6  PLT 175    Basic  Metabolic Panel:  Recent Labs Lab 06/21/16 1348 06/23/16 0421  NA 136 137  K 3.9 3.9  CL 103 103  CO2 24 24  GLUCOSE 117* 133*  BUN 15 15  CREATININE 1.21 1.18  CALCIUM 9.1 9.0    GFR: Estimated Creatinine Clearance: 87.5 mL/min (by C-G formula based on SCr of 1.18 mg/dL).  Liver Function Tests:  Recent Labs Lab 06/23/16 0421  AST 29  ALT 38  ALKPHOS 52  BILITOT 1.7*  PROT 6.4*  ALBUMIN 3.7   No results for input(s): LIPASE, AMYLASE in the last 168 hours. No results for input(s): AMMONIA  in the last 168 hours.  Coagulation Profile:  Recent Labs Lab 06/23/16 0421  INR 0.98    Cardiac Enzymes:  Recent Labs Lab 06/21/16 1829 06/21/16 2358 06/22/16 0601  TROPONINI <0.03 <0.03 <0.03    BNP (last 3 results) No results for input(s): PROBNP in the last 8760 hours.  HbA1C: No results for input(s): HGBA1C in the last 72 hours.  CBG: No results for input(s): GLUCAP in the last 168 hours.  Lipid Profile:  Recent Labs  06/23/16 0421  CHOL 210*  HDL 26*  LDLCALC UNABLE TO CALCULATE IF TRIGLYCERIDE OVER 400 mg/dL  TRIG 435*  CHOLHDL 8.1    Thyroid Function Tests: No results for input(s): TSH, T4TOTAL, FREET4, T3FREE, THYROIDAB in the last 72 hours.  Anemia Panel: No results for input(s): VITAMINB12, FOLATE, FERRITIN, TIBC, IRON, RETICCTPCT in the last 72 hours.  Urine analysis:    Component Value Date/Time   COLORURINE AMBER (A) 04/09/2012 1920   APPEARANCEUR HAZY (A) 04/09/2012 1920   LABSPEC 1.023 04/09/2012 1920   PHURINE 8.0 04/09/2012 1920   GLUCOSEU NEGATIVE 04/09/2012 1920   HGBUR NEGATIVE 04/09/2012 1920   BILIRUBINUR SMALL (A) 04/09/2012 1920   KETONESUR NEGATIVE 04/09/2012 1920   PROTEINUR NEGATIVE 04/09/2012 1920   UROBILINOGEN 1.0 04/09/2012 1920   NITRITE NEGATIVE 04/09/2012 1920   LEUKOCYTESUR NEGATIVE 04/09/2012 1920    Sepsis Labs: Lactic Acid, Venous No results found for: LATICACIDVEN  MICROBIOLOGY: No results found for this or any previous visit (from the past 240 hour(s)).  RADIOLOGY STUDIES/RESULTS: Dg Chest 2 View  Result Date: 06/21/2016 CLINICAL DATA:  61 year old male with a history of left-sided chest pain EXAM: CHEST  2 VIEW COMPARISON:  07/31/2014 FINDINGS: Cardiomediastinal silhouette within normal limits. No evidence of central vascular congestion. No pleural effusion or pneumothorax.  No confluent airspace disease. Similar appearance of coarsened interstitial markings. No displaced fracture. IMPRESSION: No  radiographic evidence of acute cardiopulmonary disease Electronically Signed   By: Corrie Mckusick D.O.   On: 06/21/2016 14:24   Nm Myocar Multi W/spect W/wall Motion / Ef  Result Date: 06/22/2016  Blood pressure demonstrated a normal response to exercise.  There was no ST segment deviation noted during stress.  Defect 1: There is a large defect of severe severity present in the basal inferior, basal inferolateral, mid inferior, mid inferolateral and apical inferior location.  Defect 2: There is a medium defect of mild severity present in the basal anterior, basal anterolateral, mid anterior and mid anterolateral location.  This is a high risk study.  The left ventricular ejection fraction is normal (55-65%).  Findings consistent with ischemia.  Excellent exercise tolerance. Hypertensive response to stress. There are two defects: 1. A large area, severe severity reversible defect in the basal and mid inferior, inferolateral and apical inferior walls, consistent with ischemia in the RCA territory. 2. A medium size, mild severity,  reversible defect in the basal and mid anterior and anterolateral walls consistent with ischemia in the LAD territory. 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 A cardiac catheterization is recommended.     LOS: 0 days   Oren Binet, MD  [facility] Pager:[phone]  If 7PM-7AM, please contact night-coverage [email or web address] Password [email or web address] 06/23/2016, 10:09 AM

## 2016-06-23 NOTE — H&P (View-Only) (Signed)
Progress Note  Patient Name: Wayne Dixon Date of Encounter: 06/23/2016  Primary Cardiologist: New/ Dr Curt Bears  Subjective   Patient denies any chest pain or SOB.   Inpatient Medications    Scheduled Meds: . allopurinol  100 mg Oral Daily  . aspirin EC  325 mg Oral Daily  . benazepril  20 mg Oral Daily  . enoxaparin (LOVENOX) injection  40 mg Subcutaneous Q24H  . folic acid  1 mg Oral Daily  . loratadine  10 mg Oral QODAY  . metoprolol tartrate  12.5 mg Oral BID  . sodium chloride flush  3 mL Intravenous Q12H  . sodium chloride flush  3 mL Intravenous Q12H   Continuous Infusions: . sodium chloride 1 mL/kg/hr (06/23/16 0704)   PRN Meds: sodium chloride, acetaminophen **OR** acetaminophen, albuterol, sodium chloride flush   Vital Signs    Vitals:   06/22/16 1110 06/22/16 1400 06/22/16 2222 06/23/16 0543  BP: (!) 185/92 127/86 123/89 115/70  Pulse:  71 74 67  Resp:  20    Temp:  97.7 F (36.5 C)  97.6 F (36.4 C)  TempSrc:  Oral  Oral  SpO2:  95%  97%  Weight:    234 lb 4.8 oz (106.3 kg)  Height:        Intake/Output Summary (Last 24 hours) at 06/23/16 0930 Last data filed at 06/23/16 0800  Gross per 24 hour  Intake             1281 ml  Output              200 ml  Net             1081 ml   Filed Weights   06/21/16 1724 06/22/16 0500 06/23/16 0543  Weight: 235 lb 14.4 oz (107 kg) 235 lb 14.4 oz (107 kg) 234 lb 4.8 oz (106.3 kg)    Telemetry    NSR/sinus brady - Personally Reviewed  ECG     NSR, LVH. ? Inferior Q waves.- Personally Reviewed  Physical Exam   GEN: No acute distress.   Neck: No JVD Cardiac: RRR, no murmurs, rubs, or gallops.  Respiratory: Clear to auscultation bilaterally. GI: Soft, nontender, non-distended  MS: No edema; No deformity. Neuro:  Nonfocal  Psych: Normal affect   Labs    Chemistry Recent Labs Lab 06/21/16 1348 06/23/16 0421  NA 136 137  K 3.9 3.9  CL 103 103  CO2 24 24  GLUCOSE 117* 133*  BUN 15 15    CREATININE 1.21 1.18  CALCIUM 9.1 9.0  PROT  --  6.4*  ALBUMIN  --  3.7  AST  --  29  ALT  --  38  ALKPHOS  --  52  BILITOT  --  1.7*  GFRNONAA >60 >60  GFRAA >60 >60  ANIONGAP 9 10     Hematology Recent Labs Lab 06/21/16 1348  WBC 6.4  RBC 4.53  HGB 14.3  HCT 40.6  MCV 89.6  MCH 31.6  MCHC 35.2  RDW 13.1  PLT 175    Cardiac Enzymes Recent Labs Lab 06/21/16 1829 06/21/16 2358 06/22/16 0601  TROPONINI <0.03 <0.03 <0.03    Recent Labs Lab 06/21/16 1359  TROPIPOC 0.00    Lipid Panel     Component Value Date/Time   CHOL 210 (H) 06/23/2016 0421   TRIG 435 (H) 06/23/2016 0421   HDL 26 (L) 06/23/2016 0421   CHOLHDL 8.1 06/23/2016 0421   VLDL UNABLE  TO CALCULATE IF TRIGLYCERIDE OVER 400 mg/dL 06/23/2016 0421   LDLCALC UNABLE TO CALCULATE IF TRIGLYCERIDE OVER 400 mg/dL 06/23/2016 0421   BNPNo results for input(s): BNP, PROBNP in the last 168 hours.   DDimer No results for input(s): DDIMER in the last 168 hours.   Radiology    Dg Chest 2 View  Result Date: 06/21/2016 CLINICAL DATA:  61 year old male with a history of left-sided chest pain EXAM: CHEST  2 VIEW COMPARISON:  07/31/2014 FINDINGS: Cardiomediastinal silhouette within normal limits. No evidence of central vascular congestion. No pleural effusion or pneumothorax.  No confluent airspace disease. Similar appearance of coarsened interstitial markings. No displaced fracture. IMPRESSION: No radiographic evidence of acute cardiopulmonary disease Electronically Signed   By: Corrie Mckusick D.O.   On: 06/21/2016 14:24   Nm Myocar Multi W/spect W/wall Motion / Ef  Result Date: 06/22/2016  Blood pressure demonstrated a normal response to exercise.  There was no ST segment deviation noted during stress.  Defect 1: There is a large defect of severe severity present in the basal inferior, basal inferolateral, mid inferior, mid inferolateral and apical inferior location.  Defect 2: There is a medium defect of mild  severity present in the basal anterior, basal anterolateral, mid anterior and mid anterolateral location.  This is a high risk study.  The left ventricular ejection fraction is normal (55-65%).  Findings consistent with ischemia.  Excellent exercise tolerance. Hypertensive response to stress. There are two defects: 1. A large area, severe severity reversible defect in the basal and mid inferior, inferolateral and apical inferior walls, consistent with ischemia in the RCA territory. 2. A medium size, mild severity, reversible defect in the basal and mid anterior and anterolateral walls consistent with ischemia in the LAD territory. 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 A cardiac catheterization is recommended.    Cardiac Studies   See above  Patient Profile     61 y.o. male with past medical history significant for HTN, HLD, psoriatic arthritis, gout, S/P hyperthyroidism, S/P parathyroidectomy who was admitted with complaints of left sided chest discomfort. He is a former Designer, television/film set [employer]    1. Chest pain, atypical -No cardiac history. Very active person. Developed left sided chest dull ache with lightheadedness that he felt was fairly minor.  -Toponins negative X 3 -CXR: No radiographic evidence of acute cardiopulmonary disease -EKG NSR 76 bpm, ?Q waves inferiorly, abnormal r wave progression- New changes. -Myoview study shows multiple perfusion abnormalities c/w ischemia. EF is normal. Exercise tolerance was good.  - plan cardiac cath today. The procedure and risks were reviewed including but not limited to death, myocardial infarction, stroke, arrythmias, bleeding, transfusion, emergency surgery, dye allergy, or renal dysfunction. The patient voices understanding and is agreeable to proceed.Marland Kitchen  HTN -Treated with benazepril 20 mg  HLD- mixed -will add statin therapy   Signed, Peter Martinique, MD  06/23/2016, 9:30 AM

## 2016-06-23 NOTE — Interval H&P Note (Signed)
Cath Lab Visit (complete for each Cath Lab visit)  Clinical Evaluation Leading to the Procedure:   ACS: No.  Non-ACS:    Anginal Classification: CCS III  Anti-ischemic medical therapy: Minimal Therapy (1 class of medications)  Non-Invasive Test Results: Intermediate-risk stress test findings: cardiac mortality 1-3%/year  Prior CABG: No previous CABG      History and Physical Interval Note:  06/23/2016 4:57 PM  Wayne Dixon  has presented today for surgery, with the diagnosis of unstable angina  The various methods of treatment have been discussed with the patient and family. After consideration of risks, benefits and other options for treatment, the patient has consented to  Procedure(s): Left Heart Cath and Coronary Angiography (N/A) as a surgical intervention .  The patient's history has been reviewed, patient examined, no change in status, stable for surgery.  I have reviewed the patient's chart and labs.  Questions were answered to the patient's satisfaction.     Shelva Majestic

## 2016-06-23 NOTE — Progress Notes (Signed)
Progress Note  Patient Name: Wayne Dixon Date of Encounter: 06/23/2016  Primary Cardiologist: New/ Dr Curt Bears  Subjective   Patient denies any chest pain or SOB.   Inpatient Medications    Scheduled Meds: . allopurinol  100 mg Oral Daily  . aspirin EC  325 mg Oral Daily  . benazepril  20 mg Oral Daily  . enoxaparin (LOVENOX) injection  40 mg Subcutaneous Q24H  . folic acid  1 mg Oral Daily  . loratadine  10 mg Oral QODAY  . metoprolol tartrate  12.5 mg Oral BID  . sodium chloride flush  3 mL Intravenous Q12H  . sodium chloride flush  3 mL Intravenous Q12H   Continuous Infusions: . sodium chloride 1 mL/kg/hr (06/23/16 0704)   PRN Meds: sodium chloride, acetaminophen **OR** acetaminophen, albuterol, sodium chloride flush   Vital Signs    Vitals:   06/22/16 1110 06/22/16 1400 06/22/16 2222 06/23/16 0543  BP: (!) 185/92 127/86 123/89 115/70  Pulse:  71 74 67  Resp:  20    Temp:  97.7 F (36.5 C)  97.6 F (36.4 C)  TempSrc:  Oral  Oral  SpO2:  95%  97%  Weight:    234 lb 4.8 oz (106.3 kg)  Height:        Intake/Output Summary (Last 24 hours) at 06/23/16 0930 Last data filed at 06/23/16 0800  Gross per 24 hour  Intake             1281 ml  Output              200 ml  Net             1081 ml   Filed Weights   06/21/16 1724 06/22/16 0500 06/23/16 0543  Weight: 235 lb 14.4 oz (107 kg) 235 lb 14.4 oz (107 kg) 234 lb 4.8 oz (106.3 kg)    Telemetry    NSR/sinus brady - Personally Reviewed  ECG     NSR, LVH. ? Inferior Q waves.- Personally Reviewed  Physical Exam   GEN: No acute distress.   Neck: No JVD Cardiac: RRR, no murmurs, rubs, or gallops.  Respiratory: Clear to auscultation bilaterally. GI: Soft, nontender, non-distended  MS: No edema; No deformity. Neuro:  Nonfocal  Psych: Normal affect   Labs    Chemistry Recent Labs Lab 06/21/16 1348 06/23/16 0421  NA 136 137  K 3.9 3.9  CL 103 103  CO2 24 24  GLUCOSE 117* 133*  BUN 15 15    CREATININE 1.21 1.18  CALCIUM 9.1 9.0  PROT  --  6.4*  ALBUMIN  --  3.7  AST  --  29  ALT  --  38  ALKPHOS  --  52  BILITOT  --  1.7*  GFRNONAA >60 >60  GFRAA >60 >60  ANIONGAP 9 10     Hematology Recent Labs Lab 06/21/16 1348  WBC 6.4  RBC 4.53  HGB 14.3  HCT 40.6  MCV 89.6  MCH 31.6  MCHC 35.2  RDW 13.1  PLT 175    Cardiac Enzymes Recent Labs Lab 06/21/16 1829 06/21/16 2358 06/22/16 0601  TROPONINI <0.03 <0.03 <0.03    Recent Labs Lab 06/21/16 1359  TROPIPOC 0.00    Lipid Panel     Component Value Date/Time   CHOL 210 (H) 06/23/2016 0421   TRIG 435 (H) 06/23/2016 0421   HDL 26 (L) 06/23/2016 0421   CHOLHDL 8.1 06/23/2016 0421   VLDL UNABLE  TO CALCULATE IF TRIGLYCERIDE OVER 400 mg/dL 06/23/2016 0421   LDLCALC UNABLE TO CALCULATE IF TRIGLYCERIDE OVER 400 mg/dL 06/23/2016 0421   BNPNo results for input(s): BNP, PROBNP in the last 168 hours.   DDimer No results for input(s): DDIMER in the last 168 hours.   Radiology    Dg Chest 2 View  Result Date: 06/21/2016 CLINICAL DATA:  61 year old male with a history of left-sided chest pain EXAM: CHEST  2 VIEW COMPARISON:  07/31/2014 FINDINGS: Cardiomediastinal silhouette within normal limits. No evidence of central vascular congestion. No pleural effusion or pneumothorax.  No confluent airspace disease. Similar appearance of coarsened interstitial markings. No displaced fracture. IMPRESSION: No radiographic evidence of acute cardiopulmonary disease Electronically Signed   By: Corrie Mckusick D.O.   On: 06/21/2016 14:24   Nm Myocar Multi W/spect W/wall Motion / Ef  Result Date: 06/22/2016  Blood pressure demonstrated a normal response to exercise.  There was no ST segment deviation noted during stress.  Defect 1: There is a large defect of severe severity present in the basal inferior, basal inferolateral, mid inferior, mid inferolateral and apical inferior location.  Defect 2: There is a medium defect of mild  severity present in the basal anterior, basal anterolateral, mid anterior and mid anterolateral location.  This is a high risk study.  The left ventricular ejection fraction is normal (55-65%).  Findings consistent with ischemia.  Excellent exercise tolerance. Hypertensive response to stress. There are two defects: 1. A large area, severe severity reversible defect in the basal and mid inferior, inferolateral and apical inferior walls, consistent with ischemia in the RCA territory. 2. A medium size, mild severity, reversible defect in the basal and mid anterior and anterolateral walls consistent with ischemia in the LAD territory. 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 A cardiac catheterization is recommended.    Cardiac Studies   See above  Patient Profile     61 y.o. male with past medical history significant for HTN, HLD, psoriatic arthritis, gout, S/P hyperthyroidism, S/P parathyroidectomy who was admitted with complaints of left sided chest discomfort. He is a former Designer, television/film set [employer]    1. Chest pain, atypical -No cardiac history. Very active person. Developed left sided chest dull ache with lightheadedness that he felt was fairly minor.  -Toponins negative X 3 -CXR: No radiographic evidence of acute cardiopulmonary disease -EKG NSR 76 bpm, ?Q waves inferiorly, abnormal r wave progression- New changes. -Myoview study shows multiple perfusion abnormalities c/w ischemia. EF is normal. Exercise tolerance was good.  - plan cardiac cath today. The procedure and risks were reviewed including but not limited to death, myocardial infarction, stroke, arrythmias, bleeding, transfusion, emergency surgery, dye allergy, or renal dysfunction. The patient voices understanding and is agreeable to proceed.Marland Kitchen  HTN -Treated with benazepril 20 mg  HLD- mixed -will add statin therapy   Signed, Mariame Rybolt [place], MD  06/23/2016, 9:30 AM

## 2016-06-23 NOTE — Plan of Care (Signed)
Problem: Education: Goal: Understanding of CV disease, CV risk reduction, and recovery process will improve Outcome: Completed/Met Date Met: 06/23/16 All instructions reviewed, questions answered and patient able to verbalize understanding by explaining what was going to happen in the morning. Pt also educated on the care of his site post procedure and when he goes home, given S/S of hematoma and infection and to either call MD or go to nearest ED/Urgent Care ASAP and patient verbalized understanding. Pt watched video and said that I reiterated what the MD and video had said, handout also given.

## 2016-06-23 NOTE — Progress Notes (Signed)
Patient and significant other notified to call spiritual care services when ready to have advanced directives signed. Also notified that they must contact an outside agency to come and notarize the handwritten will. Both verbalize understanding.

## 2016-06-23 NOTE — Progress Notes (Signed)
Transported patient to [id number]. Upon arrival to room patient described a vision disturbance in his right eye. He described it as a wavy effect that was slightly opaque .  Patient had equal strength in both hands, and feet. Equal facial symmetry and equal when smiling, pupils equal and eyes tracking. Patient has no headache or other symptoms.  Dr. Claiborne Billings informed and patients symptoms completed resolved as Dr. Claiborne Billings arrived.  Duration of symptoms approximately  7 minutes.   Dr Claiborne Billings arrived examined patient and felt it was and effect of sedation.

## 2016-06-24 ENCOUNTER — Encounter (HOSPITAL_COMMUNITY): Payer: Self-pay | Admitting: Cardiovascular Disease

## 2016-06-24 DIAGNOSIS — I10 Essential (primary) hypertension: Secondary | ICD-10-CM | POA: Diagnosis not present

## 2016-06-24 DIAGNOSIS — R079 Chest pain, unspecified: Secondary | ICD-10-CM | POA: Diagnosis not present

## 2016-06-24 DIAGNOSIS — L4050 Arthropathic psoriasis, unspecified: Secondary | ICD-10-CM | POA: Diagnosis not present

## 2016-06-24 DIAGNOSIS — E782 Mixed hyperlipidemia: Secondary | ICD-10-CM | POA: Diagnosis not present

## 2016-06-24 DIAGNOSIS — R9439 Abnormal result of other cardiovascular function study: Secondary | ICD-10-CM | POA: Diagnosis not present

## 2016-06-24 LAB — BASIC METABOLIC PANEL
Anion gap: 8 (ref 5–15)
BUN: 14 mg/dL (ref 6–20)
CHLORIDE: 104 mmol/L (ref 101–111)
CO2: 23 mmol/L (ref 22–32)
CREATININE: 1.12 mg/dL (ref 0.61–1.24)
Calcium: 8.5 mg/dL — ABNORMAL LOW (ref 8.9–10.3)
GFR calc Af Amer: >60 mL/min (ref >60)
GFR calc non Af Amer: >60 mL/min (ref >60)
GLUCOSE: 118 mg/dL — AB (ref 65–99)
Potassium: 4.1 mmol/L (ref 3.5–5.1)
SODIUM: 135 mmol/L (ref 135–145)

## 2016-06-24 LAB — CBC
HEMATOCRIT: 39.1 % (ref 39.0–52.0)
HEMOGLOBIN: 13.3 g/dL (ref 13.0–17.0)
MCH: 30.9 pg (ref 26.0–34.0)
MCHC: 34 g/dL (ref 30.0–36.0)
MCV: 90.7 fL (ref 78.0–100.0)
Platelets: 158 10*3/uL (ref 150–400)
RBC: 4.31 MIL/uL (ref 4.22–5.81)
RDW: 13 % (ref 11.5–15.5)
WBC: 4.9 10*3/uL (ref 4.0–10.5)

## 2016-06-24 MED ORDER — FENOFIBRATE 160 MG PO TABS
160.0000 mg | ORAL_TABLET | Freq: Every day | ORAL | Status: DC
  Administered 2016-06-24: 160 mg via ORAL
  Filled 2016-06-24: qty 1

## 2016-06-24 MED ORDER — FENOFIBRATE 160 MG PO TABS: 160.0000 mg | ORAL_TABLET | Freq: Every day | ORAL | 0 refills | Status: AC

## 2016-06-24 MED ORDER — BENAZEPRIL HCL 40 MG PO TABS: 40.0000 mg | ORAL_TABLET | Freq: Every day | ORAL | 0 refills | Status: AC

## 2016-06-24 MED ORDER — ASPIRIN 81 MG PO CHEW: 81.0000 mg | CHEWABLE_TABLET | Freq: Every day | ORAL | Status: AC

## 2016-06-24 NOTE — Discharge Summary (Signed)
PATIENT DETAILS Name: Wayne Dixon Age: 61 y.o. Sex: male Date of Birth: 23-Dec-1955 MRN: [id number]. Admitting Physician: Modena Jansky, MD CXK:[id number] Saralyn Pilar, MD  Admit Date: 06/21/2016 Discharge date: 06/24/2016  Recommendations for Outpatient Follow-up:  1. Follow up with PCP in 1-2 weeks 2. Please obtain BMP/CBC in one week  Admitted From:  Home  Disposition: [place]: No  Equipment/Devices: None  Discharge Condition: Stable  CODE STATUS: FULL CODE  Diet recommendation:  Heart Healthy   Brief Summary: See H&P, Labs, Consult and Test reports for all details in brief, Patient is a 61 y.o. 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   Brief Hospital Course: Atypical Chest pain: chest pain free since admission. Troponin negative. Seen by cardiology-underwent Nuclear Stress Test-this was read as high risk,subsequently underwent LHC-that showed no significant CAD. Stable for discharge today   Hypertriglyceridemia: Was on Statin during this hospital stay-but since no CAD on LHC-will switch to Tricor today. Will need repeat Lipid panel in 3 months.  Essential hypertension:Controlled-will increase benazepril to 40 mg. Follow with PCP for further optimization.    Psoriatic arthritis: Well controlled on Enbrel. Follows with rheumatology outpatient  Procedures/Studies: LHC 3/26>> Normal coronary arteries. Normal LV function with an ejection fraction of 55%.   Discharge Diagnoses:  Principal Problem:   Chest pain Active Problems:   Essential hypertension   Hyperlipidemia   Psoriatic arthritis (HCC)   Gout   Abnormal nuclear stress test   Discharge Instructions:  Activity:  As tolerated with Full fall precautions use walker/cane & assistance as needed   Discharge Instructions    Call MD for:  difficulty breathing, headache or visual disturbances    Complete by:  As directed    Call MD for:   severe uncontrolled pain    Complete by:  As directed    Diet - low sodium heart healthy    Complete by:  As directed    Diet - low sodium heart healthy    Complete by:  As directed    Discharge instructions    Complete by:  As directed    Follow with Primary MD  Anthoney Harada, MD in 1 week  Please get a complete blood count and chemistry panel checked by your Primary MD at your next visit, and again as instructed by your Primary MD.  Get Medicines reviewed and adjusted: Please take all your medications with you for your next visit with your Primary MD  Laboratory/radiological data: Please request your Primary MD to go over all hospital tests and procedure/radiological results at the follow up, please ask your Primary MD to get all Hospital records sent to his/her office.  In some cases, they will be blood work, cultures and biopsy results pending at the time of your discharge. Please request that your primary care M.D. follows up on these results.  Also Note the following: If you experience worsening of your admission symptoms, develop shortness of breath, life threatening emergency, suicidal or homicidal thoughts you must seek medical attention immediately by calling 911 or calling your MD immediately  if symptoms less severe.  You must read complete instructions/literature along with all the possible adverse reactions/side effects for all the Medicines you take and that have been prescribed to you. Take any new Medicines after you have completely understood and accpet all the possible adverse reactions/side effects.   Do not drive when taking Pain medications or sleeping medications (Benzodaizepines)  Do not take more than prescribed Pain, Sleep and Anxiety Medications. It is not advisable to combine anxiety,sleep and pain medications without talking with your primary care practitioner  Special Instructions: If you have smoked or chewed Tobacco  in the last 2 yrs please stop  smoking, stop any regular Alcohol  and or any Recreational drug use.  Wear Seat belts while driving.  Please note: You were cared for by a hospitalist during your hospital stay. Once you are discharged, your primary care physician will handle any further medical issues. Please note that NO REFILLS for any discharge medications will be authorized once you are discharged, as it is imperative that you return to your primary care physician (or establish a relationship with a primary care physician if you do not have one) for your post hospital discharge needs so that they can reassess your need for medications and monitor your lab values.   Increase activity slowly    Complete by:  As directed    Increase activity slowly    Complete by:  As directed      Allergies as of 06/24/2016      Reactions   Atorvastatin Other (See Comments)   Joint pain      Medication List    TAKE these medications   albuterol 108 (90 Base) MCG/ACT inhaler Commonly known as:  PROVENTIL HFA;VENTOLIN HFA Inhale 2 puffs into the lungs every 6 (six) hours as needed for wheezing or shortness of breath (allergies).   allopurinol 100 MG tablet Commonly known as:  ZYLOPRIM Take 100 mg by mouth daily.   aspirin 81 MG chewable tablet Chew 1 tablet (81 mg total) by mouth daily.   benazepril 40 MG tablet Commonly known as:  LOTENSIN Take 1 tablet (40 mg total) by mouth daily. What changed:  medication strength  how much to take   ENBREL SURECLICK 50 MG/ML injection Generic drug:  etanercept Inject 50 mg into the skin every Saturday.   fenofibrate 160 MG tablet Take 1 tablet (160 mg total) by mouth daily.   folic acid 1 MG tablet Commonly known as:  FOLVITE Take 1 mg by mouth daily.   hydrocortisone cream 1 % Apply 1 application topically daily as needed (heat rash).   loratadine 10 MG tablet Commonly known as:  CLARITIN Take 10 mg by mouth every other day.      Follow-up Information     Anthoney Harada, MD. Schedule an appointment as soon as possible for a visit in 1 week(s).   Specialty:  Family Medicine Contact information: [place] [address] [phone]          Allergies  Allergen Reactions  . Atorvastatin Other (See Comments)    Joint pain    Consultations:   cardiology  Other Procedures/Studies: Dg Chest 2 View  Result Date: 06/21/2016 CLINICAL DATA:  61 year old male with a history of left-sided chest pain EXAM: CHEST  2 VIEW COMPARISON:  07/31/2014 FINDINGS: Cardiomediastinal silhouette within normal limits. No evidence of central vascular congestion. No pleural effusion or pneumothorax.  No confluent airspace disease. Similar appearance of coarsened interstitial markings. No displaced fracture. IMPRESSION: No radiographic evidence of acute cardiopulmonary disease Electronically Signed   By: Corrie Mckusick D.O.   On: 06/21/2016 14:24   Nm Myocar Multi W/spect W/wall Motion / Ef  Result Date: 06/22/2016  Blood pressure demonstrated a normal response to exercise.  There was no ST segment deviation noted during stress.  Defect 1: There is  a large defect of severe severity present in the basal inferior, basal inferolateral, mid inferior, mid inferolateral and apical inferior location.  Defect 2: There is a medium defect of mild severity present in the basal anterior, basal anterolateral, mid anterior and mid anterolateral location.  This is a high risk study.  The left ventricular ejection fraction is normal (55-65%).  Findings consistent with ischemia.  Excellent exercise tolerance. Hypertensive response to stress. There are two defects: 1. A large area, severe severity reversible defect in the basal and mid inferior, inferolateral and apical inferior walls, consistent with ischemia in the RCA territory. 2. A medium size, mild severity, reversible defect in the basal and mid anterior and anterolateral walls consistent with ischemia  in the LAD territory. 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 A cardiac catheterization is recommended.      TODAY-DAY OF DISCHARGE:  Subjective:   Wayne Dixon today has no headache,no chest abdominal pain,no new weakness tingling or numbness, feels much better wants to go home today  Objective:   Blood pressure 113/86, pulse (!) 56, temperature 97.6 F (36.4 C), temperature source Axillary, resp. rate 18, height 6\' 2"  (1.88 m), weight 107.4 kg (236 lb 12.8 oz), SpO2 96 %.  Intake/Output Summary (Last 24 hours) at 06/24/16 0854 Last data filed at 06/24/16 0745  Gross per 24 hour  Intake             1531 ml  Output              852 ml  Net              679 ml   Filed Weights   06/22/16 0500 06/23/16 0543 06/24/16 0539  Weight: 107 kg (235 lb 14.4 oz) 106.3 kg (234 lb 4.8 oz) 107.4 kg (236 lb 12.8 oz)    Exam: Awake Alert, Oriented *3, No new F.N deficits, Normal affect [place].AT,PERRAL Supple Neck,No JVD, No cervical lymphadenopathy appriciated.  Symmetrical Chest wall movement, Good air movement bilaterally, CTAB RRR,No Gallops,Rubs or new Murmurs, No Parasternal Heave +ve B.Sounds, Abd Soft, Non tender, No organomegaly appriciated, No rebound -guarding or rigidity. No Cyanosis, Clubbing or edema, No new Rash or bruise   PERTINENT RADIOLOGIC STUDIES: Dg Chest 2 View  Result Date: 06/21/2016 CLINICAL DATA:  61 year old male with a history of left-sided chest pain EXAM: CHEST  2 VIEW COMPARISON:  07/31/2014 FINDINGS: Cardiomediastinal silhouette within normal limits. No evidence of central vascular congestion. No pleural effusion or pneumothorax.  No confluent airspace disease. Similar appearance of coarsened interstitial markings. No displaced fracture. IMPRESSION: No radiographic evidence of acute cardiopulmonary disease Electronically Signed   By: Corrie Mckusick D.O.   On: 06/21/2016 14:24   Nm Myocar Multi W/spect W/wall Motion / Ef  Result Date:  06/22/2016  Blood pressure demonstrated a normal response to exercise.  There was no ST segment deviation noted during stress.  Defect 1: There is a large defect of severe severity present in the basal inferior, basal inferolateral, mid inferior, mid inferolateral and apical inferior location.  Defect 2: There is a medium defect of mild severity present in the basal anterior, basal anterolateral, mid anterior and mid anterolateral location.  This is a high risk study.  The left ventricular ejection fraction is normal (55-65%).  Findings consistent with ischemia.  Excellent exercise tolerance. Hypertensive response to stress. There are two defects: 1. A large area, severe severity reversible defect in the basal and mid inferior, inferolateral and  apical inferior walls, consistent with ischemia in the RCA territory. 2. A medium size, mild severity, reversible defect in the basal and mid anterior and anterolateral walls consistent with ischemia in the LAD territory. 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 A cardiac catheterization is recommended.     PERTINENT LAB RESULTS: CBC:  Recent Labs  06/21/16 1348 06/24/16 0514  WBC 6.4 4.9  HGB 14.3 13.3  HCT 40.6 39.1  PLT 175 158   CMET CMP     Component Value Date/Time   NA 135 06/24/2016 0514   K 4.1 06/24/2016 0514   CL 104 06/24/2016 0514   CO2 23 06/24/2016 0514   GLUCOSE 118 (H) 06/24/2016 0514   BUN 14 06/24/2016 0514   CREATININE 1.12 06/24/2016 0514   CALCIUM 8.5 (L) 06/24/2016 0514   PROT 6.4 (L) 06/23/2016 0421   ALBUMIN 3.7 06/23/2016 0421   AST 29 06/23/2016 0421   ALT 38 06/23/2016 0421   ALKPHOS 52 06/23/2016 0421   BILITOT 1.7 (H) 06/23/2016 0421   GFRNONAA >60 06/24/2016 0514   GFRAA >60 06/24/2016 0514    GFR Estimated Creatinine Clearance: 92.7 mL/min (by C-G formula based on SCr of 1.12 mg/dL). No results for input(s): LIPASE, AMYLASE in the last 72 hours.  Recent Labs   06/21/16 1829 06/21/16 2358 06/22/16 0601  TROPONINI <0.03 <0.03 <0.03   Invalid input(s): POCBNP No results for input(s): DDIMER in the last 72 hours. No results for input(s): HGBA1C in the last 72 hours.  Recent Labs  06/23/16 0421  CHOL 210*  HDL 26*  LDLCALC UNABLE TO CALCULATE IF TRIGLYCERIDE OVER 400 mg/dL  TRIG 435*  CHOLHDL 8.1   No results for input(s): TSH, T4TOTAL, T3FREE, THYROIDAB in the last 72 hours.  Invalid input(s): FREET3 No results for input(s): VITAMINB12, FOLATE, FERRITIN, TIBC, IRON, RETICCTPCT in the last 72 hours. Coags:  Recent Labs  06/23/16 0421  INR 0.98   Microbiology: No results found for this or any previous visit (from the past 240 hour(s)).  FURTHER DISCHARGE INSTRUCTIONS:  Get Medicines reviewed and adjusted: Please take all your medications with you for your next visit with your Primary MD  Laboratory/radiological data: Please request your Primary MD to go over all hospital tests and procedure/radiological results at the follow up, please ask your Primary MD to get all Hospital records sent to his/her office.  In some cases, they will be blood work, cultures and biopsy results pending at the time of your discharge. Please request that your primary care M.D. goes through all the records of your hospital data and follows up on these results.  Also Note the following: If you experience worsening of your admission symptoms, develop shortness of breath, life threatening emergency, suicidal or homicidal thoughts you must seek medical attention immediately by calling 911 or calling your MD immediately  if symptoms less severe.  You must read complete instructions/literature along with all the possible adverse reactions/side effects for all the Medicines you take and that have been prescribed to you. Take any new Medicines after you have completely understood and accpet all the possible adverse reactions/side effects.   Do not drive when  taking Pain medications or sleeping medications (Benzodaizepines)  Do not take more than prescribed Pain, Sleep and Anxiety Medications. It is not advisable to combine anxiety,sleep and pain medications without talking with your primary care practitioner  Special Instructions: If you have smoked or chewed Tobacco  in the last 2 yrs  please stop smoking, stop any regular Alcohol  and or any Recreational drug use.  Wear Seat belts while driving.  Please note: You were cared for by a hospitalist during your hospital stay. Once you are discharged, your primary care physician will handle any further medical issues. Please note that NO REFILLS for any discharge medications will be authorized once you are discharged, as it is imperative that you return to your primary care physician (or establish a relationship with a primary care physician if you do not have one) for your post hospital discharge needs so that they can reassess your need for medications and monitor your lab values.  Total Time spent coordinating discharge including counseling, education and face to face time equals 30 minutes.  SignedOren Binet 06/24/2016 8:54 AM

## 2016-06-24 NOTE — Progress Notes (Signed)
Progress Note  Patient Name: Wayne Dixon Date of Encounter: 06/24/2016  Primary Cardiologist: New/ Dr Curt Bears  Subjective   Patient denies any chest pain or SOB. The visual change that he noted yesterday post-porocedure had resolved by the time he go to his 3W room and has not reoccurred.   Inpatient Medications    Scheduled Meds: . allopurinol  100 mg Oral Daily  . aspirin  81 mg Oral Daily  . benazepril  20 mg Oral Daily  . enoxaparin (LOVENOX) injection  40 mg Subcutaneous Q24H  . folic acid  1 mg Oral Daily  . loratadine  10 mg Oral QODAY  . metoprolol tartrate  12.5 mg Oral BID  . rosuvastatin  20 mg Oral q1800  . sodium chloride flush  3 mL Intravenous Q12H  . sodium chloride flush  3 mL Intravenous Q12H   Continuous Infusions:  PRN Meds: sodium chloride, acetaminophen **OR** acetaminophen, acetaminophen, albuterol, ondansetron (ZOFRAN) IV, sodium chloride flush   Vital Signs    Vitals:   06/23/16 1735 06/23/16 1740 06/23/16 2215 06/24/16 0539  BP: 127/85 121/78 128/85 113/86  Pulse: 66 60 66 (!) 56  Resp: 16 14 16 18   Temp:   97.5 F (36.4 C) 97.6 F (36.4 C)  TempSrc:   Oral Axillary  SpO2: 98% 100% 96% 96%  Weight:    236 lb 12.8 oz (107.4 kg)  Height:        Intake/Output Summary (Last 24 hours) at 06/24/16 0838 Last data filed at 06/24/16 0745  Gross per 24 hour  Intake             1531 ml  Output              852 ml  Net              679 ml   Filed Weights   06/22/16 0500 06/23/16 0543 06/24/16 0539  Weight: 235 lb 14.4 oz (107 kg) 234 lb 4.8 oz (106.3 kg) 236 lb 12.8 oz (107.4 kg)    Telemetry   NSR/sinus brady rates upper 50's - 60's. - Personally Reviewed  ECG     3/24- EKG NSR, LVH. ? Inferior Q waves. I did not see the deep q waves on EKG during stress test. - Personally Reviewed  Physical Exam   GEN: No acute distress.   Neck: No JVD Cardiac: RRR, no murmurs, rubs, or gallops.  Respiratory: Clear to auscultation  bilaterally. GI: Soft, nontender, non-distended  MS: No edema; No deformity. Neuro:  Nonfocal  Psych: Normal affect   Right wrist without hematoma  Labs    Chemistry  Recent Labs Lab 06/21/16 1348 06/23/16 0421 06/24/16 0514  NA 136 137 135  K 3.9 3.9 4.1  CL 103 103 104  CO2 24 24 23   GLUCOSE 117* 133* 118*  BUN 15 15 14   CREATININE 1.21 1.18 1.12  CALCIUM 9.1 9.0 8.5*  PROT  --  6.4*  --   ALBUMIN  --  3.7  --   AST  --  29  --   ALT  --  38  --   ALKPHOS  --  52  --   BILITOT  --  1.7*  --   GFRNONAA >60 >60 >60  GFRAA >60 >60 >60  ANIONGAP 9 10 8      Hematology  Recent Labs Lab 06/21/16 1348 06/24/16 0514  WBC 6.4 4.9  RBC 4.53 4.31  HGB 14.3 13.3  HCT  40.6 39.1  MCV 89.6 90.7  MCH 31.6 30.9  MCHC 35.2 34.0  RDW 13.1 13.0  PLT 175 158    Cardiac Enzymes  Recent Labs Lab 06/21/16 1829 06/21/16 2358 06/22/16 0601  TROPONINI <0.03 <0.03 <0.03     Recent Labs Lab 06/21/16 1359  TROPIPOC 0.00    Lipid Panel     Component Value Date/Time   CHOL 210 (H) 06/23/2016 0421   TRIG 435 (H) 06/23/2016 0421   HDL 26 (L) 06/23/2016 0421   CHOLHDL 8.1 06/23/2016 0421   VLDL UNABLE TO CALCULATE IF TRIGLYCERIDE OVER 400 mg/dL 06/23/2016 0421   LDLCALC UNABLE TO CALCULATE IF TRIGLYCERIDE OVER 400 mg/dL 06/23/2016 0421   BNPNo results for input(s): BNP, PROBNP in the last 168 hours.   DDimer No results for input(s): DDIMER in the last 168 hours.   Radiology    Nm Myocar Multi W/spect W/wall Motion / Ef  Result Date: 06/22/2016  Blood pressure demonstrated a normal response to exercise.  There was no ST segment deviation noted during stress.  Defect 1: There is a large defect of severe severity present in the basal inferior, basal inferolateral, mid inferior, mid inferolateral and apical inferior location.  Defect 2: There is a medium defect of mild severity present in the basal anterior, basal anterolateral, mid anterior and mid anterolateral  location.  This is a high risk study.  The left ventricular ejection fraction is normal (55-65%).  Findings consistent with ischemia.  Excellent exercise tolerance. Hypertensive response to stress. There are two defects: 1. A large area, severe severity reversible defect in the basal and mid inferior, inferolateral and apical inferior walls, consistent with ischemia in the RCA territory. 2. A medium size, mild severity, reversible defect in the basal and mid anterior and anterolateral walls consistent with ischemia in the LAD territory. 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 A cardiac catheterization is recommended.    Cardiac Studies   Cardiac catheterization 06/23/2016  The left ventricular systolic function is normal.  LV end diastolic pressure is normal.   Normal coronary arteries.  Normal LV function with an ejection fraction of 55%.  RECOMMENDATION: Medical therapy for his hypertension and hyperlipidemia.   Patient Profile     61 y.o. male with past medical history significant for HTN, HLD, psoriatic arthritis, gout, S/P hyperthyroidism, S/P parathyroidectomy who was admitted with complaints of left sided chest discomfort. He is a former Designer, television[employer]    Chest pain, atypical -No cardiac history. Very active person. Developed left sided chest dull ache with lightheadedness that he felt was fairly minor.  -Toponins negative X 3 -CXR: No radiographic evidence of acute cardiopulmonary disease -EKG NSR 76 bpm, ?Q waves inferiorly, abnormal r wave progression- New changes. -Myoview study shows multiple perfusion abnormalities c/w ischemia. EF is normal. Exercise tolerance was good.  -cardiac cath was done yesterday and was found to have normal coronary arteries with EF 55%. -SCr today is 1.12 (1.18), Hgb 13.3 -Plan is medical therapy for hypertension and hyperlipidemia.   HTN -Treated with benazepril 20 mg. BP well  controlled.  HLD- mixed -Lipid panel 06/23/16: TC 210, Trig 435, LDL unable to calculate, HDL 26 -Crestor 20 mg. Will start on fenofibreate for triglycerides. -Discussed TLC's.  Signed, Daune Perch, NP  06/24/2016, 8:38 AM   Patient seen and examined and history reviewed. Agree with above findings and plan. Doing well this am. No complications from cath. Right radial site  without hematoma. Arm restrictions reviewed. Plan to return to work next Monday. Findings of cardiac cath normal. False positive nuclear stress test. Follow up with primary care.   Peter Martinique, [place] 06/24/2016 10:05 AM

## 2016-06-24 NOTE — Plan of Care (Signed)
Problem: Health Behavior/Discharge Planning: Goal: Ability to safely manage health-related needs after discharge will improve Outcome: Completed/Met Date Met: 06/24/16 Patient understands reasons for proper nutrition and medications schedule

## 2016-06-24 NOTE — Plan of Care (Signed)
Problem: Health Behavior/Discharge Planning: Goal: Ability to manage health-related needs will improve Outcome: Adequate for Discharge Patient understands reason for medications and proper diet. Ambulates in room with no assist.

## 2016-06-24 NOTE — Progress Notes (Signed)
Patient being discharged home per MD order. All discharge instructions given to patient both verbally and in written format .

## 2019-03-20 IMAGING — CR DG CHEST 2V
2 series · 2 of 2 positions shown · non-contrast
Comparison: 07/31/2014

CLINICAL DATA: 60-year-old male with a history of left-sided chest
pain

EXAM:
CHEST  2 VIEW

[chest pa]
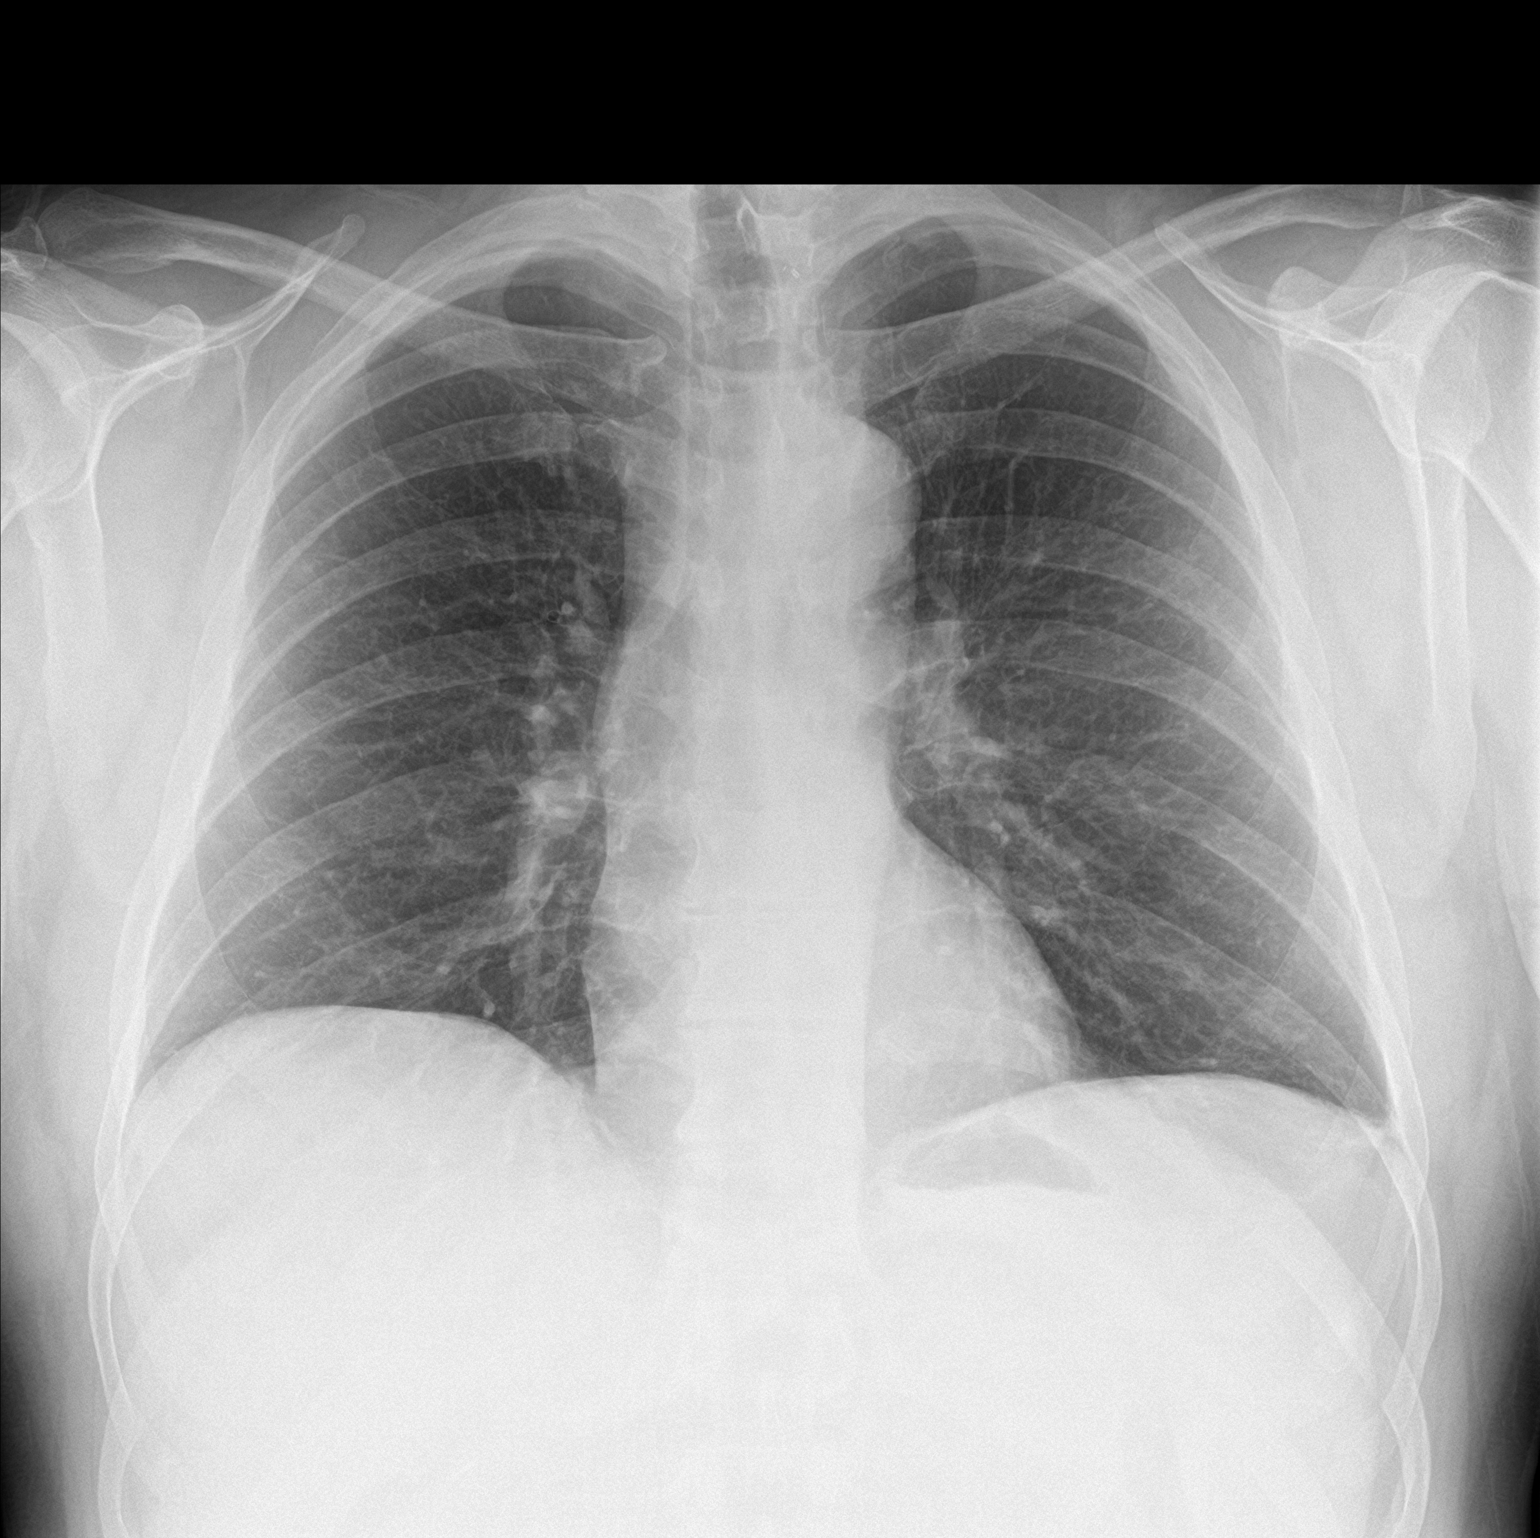

[chest lat]
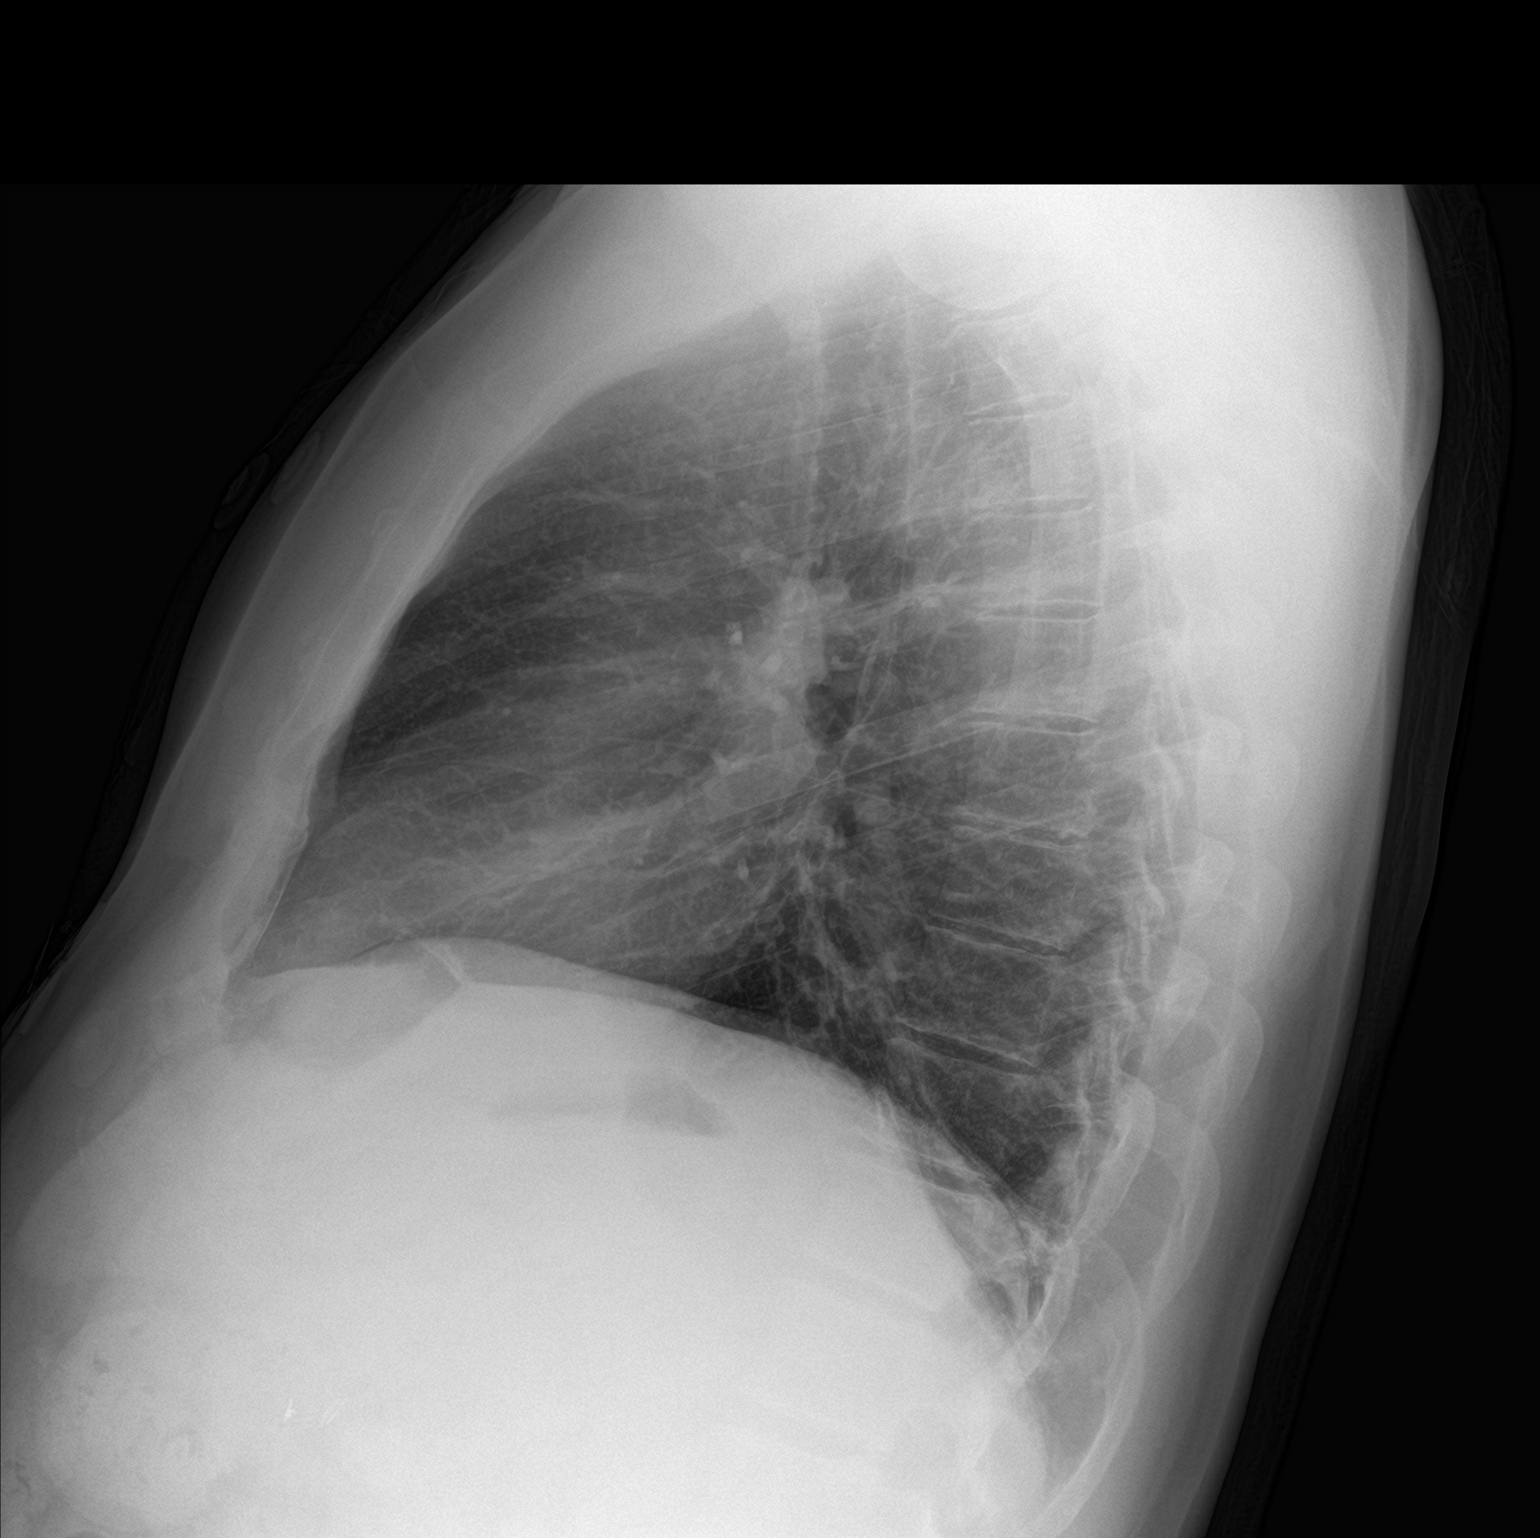

[2 of 2 positions shown; findings below may reference images not displayed]

FINDINGS: Cardiomediastinal silhouette within normal limits.

No evidence of central vascular congestion.

No pleural effusion or pneumothorax.  No confluent airspace disease.

Similar appearance of coarsened interstitial markings.

No displaced fracture.
IMPRESSION: No radiographic evidence of acute cardiopulmonary disease
